# Patient Record
Sex: Female | Born: 1975
Health system: Southern US, Community
[De-identification: ages and names within clinical notes are randomized; demographics above are authoritative.]

## PROBLEM LIST (undated history)

## (undated) DIAGNOSIS — F329 Major depressive disorder, single episode, unspecified: Secondary | ICD-10-CM

## (undated) DIAGNOSIS — F32A Depression, unspecified: Secondary | ICD-10-CM

## (undated) DIAGNOSIS — R109 Unspecified abdominal pain: Secondary | ICD-10-CM

## (undated) DIAGNOSIS — F419 Anxiety disorder, unspecified: Secondary | ICD-10-CM

## (undated) DIAGNOSIS — R51 Headache: Secondary | ICD-10-CM

## (undated) DIAGNOSIS — J302 Other seasonal allergic rhinitis: Secondary | ICD-10-CM

## (undated) DIAGNOSIS — J45909 Unspecified asthma, uncomplicated: Secondary | ICD-10-CM

## (undated) DIAGNOSIS — N3281 Overactive bladder: Principal | ICD-10-CM

## (undated) DIAGNOSIS — T7840XA Allergy, unspecified, initial encounter: Secondary | ICD-10-CM

## (undated) DIAGNOSIS — J343 Hypertrophy of nasal turbinates: Secondary | ICD-10-CM

## (undated) DIAGNOSIS — Z309 Encounter for contraceptive management, unspecified: Secondary | ICD-10-CM

## (undated) DIAGNOSIS — Z800 Family history of malignant neoplasm of digestive organs: Secondary | ICD-10-CM

## (undated) DIAGNOSIS — N949 Unspecified condition associated with female genital organs and menstrual cycle: Principal | ICD-10-CM

## (undated) DIAGNOSIS — R112 Nausea with vomiting, unspecified: Secondary | ICD-10-CM

## (undated) DIAGNOSIS — N898 Other specified noninflammatory disorders of vagina: Secondary | ICD-10-CM

## (undated) DIAGNOSIS — N3010 Interstitial cystitis (chronic) without hematuria: Principal | ICD-10-CM

## (undated) DIAGNOSIS — Z9889 Other specified postprocedural states: Secondary | ICD-10-CM

## (undated) DIAGNOSIS — R350 Frequency of micturition: Secondary | ICD-10-CM

## (undated) DIAGNOSIS — J342 Deviated nasal septum: Secondary | ICD-10-CM

## (undated) DIAGNOSIS — K219 Gastro-esophageal reflux disease without esophagitis: Secondary | ICD-10-CM

## (undated) HISTORY — DX: Other specified noninflammatory disorders of vagina: N89.8

## (undated) HISTORY — DX: Encounter for contraceptive management, unspecified: Z30.9

## (undated) HISTORY — DX: Unspecified asthma, uncomplicated: J45.909

## (undated) HISTORY — DX: Interstitial cystitis (chronic) without hematuria: N30.10

## (undated) HISTORY — DX: Unspecified abdominal pain: R10.9

## (undated) HISTORY — PX: OTHER SURGICAL HISTORY: SHX169

## (undated) HISTORY — DX: Allergy, unspecified, initial encounter: T78.40XA

## (undated) HISTORY — DX: Unspecified condition associated with female genital organs and menstrual cycle: N94.9

## (undated) HISTORY — DX: Overactive bladder: N32.81

## (undated) HISTORY — DX: Family history of malignant neoplasm of digestive organs: Z80.0

---

## 2000-06-14 ENCOUNTER — Other Ambulatory Visit: Admission: RE | Admit: 2000-06-14 | Discharge: 2000-06-14 | Payer: Self-pay | Admitting: *Deleted

## 2001-08-14 ENCOUNTER — Ambulatory Visit (HOSPITAL_COMMUNITY): Admission: RE | Admit: 2001-08-14 | Discharge: 2001-08-14 | Payer: Self-pay | Admitting: General Surgery

## 2001-08-14 HISTORY — PX: CYST EXCISION: SHX5701

## 2001-12-26 ENCOUNTER — Emergency Department (HOSPITAL_COMMUNITY): Admission: EM | Admit: 2001-12-26 | Discharge: 2001-12-27 | Payer: Self-pay | Admitting: Emergency Medicine

## 2003-09-20 ENCOUNTER — Ambulatory Visit (HOSPITAL_COMMUNITY): Admission: RE | Admit: 2003-09-20 | Discharge: 2003-09-20 | Payer: Self-pay | Admitting: *Deleted

## 2005-05-22 ENCOUNTER — Emergency Department (HOSPITAL_COMMUNITY): Admission: EM | Admit: 2005-05-22 | Discharge: 2005-05-22 | Payer: Self-pay | Admitting: Emergency Medicine

## 2005-06-24 ENCOUNTER — Ambulatory Visit (HOSPITAL_COMMUNITY): Admission: RE | Admit: 2005-06-24 | Discharge: 2005-06-24 | Payer: Self-pay | Admitting: Family Medicine

## 2005-06-29 ENCOUNTER — Encounter (HOSPITAL_COMMUNITY): Admission: RE | Admit: 2005-06-29 | Discharge: 2005-07-29 | Payer: Self-pay | Admitting: Family Medicine

## 2005-07-14 ENCOUNTER — Observation Stay (HOSPITAL_COMMUNITY): Admission: RE | Admit: 2005-07-14 | Discharge: 2005-07-15 | Payer: Self-pay | Admitting: General Surgery

## 2005-07-14 ENCOUNTER — Encounter (INDEPENDENT_AMBULATORY_CARE_PROVIDER_SITE_OTHER): Payer: Self-pay | Admitting: *Deleted

## 2005-07-14 HISTORY — PX: CHOLECYSTECTOMY: SHX55

## 2006-04-26 ENCOUNTER — Emergency Department (HOSPITAL_COMMUNITY): Admission: EM | Admit: 2006-04-26 | Discharge: 2006-04-26 | Payer: Self-pay | Admitting: Emergency Medicine

## 2006-06-21 ENCOUNTER — Ambulatory Visit (HOSPITAL_COMMUNITY): Admission: RE | Admit: 2006-06-21 | Discharge: 2006-06-21 | Payer: Self-pay | Admitting: Obstetrics and Gynecology

## 2006-10-20 ENCOUNTER — Inpatient Hospital Stay (HOSPITAL_COMMUNITY): Admission: RE | Admit: 2006-10-20 | Discharge: 2006-10-23 | Payer: Self-pay | Admitting: Obstetrics & Gynecology

## 2006-10-20 ENCOUNTER — Encounter: Payer: Self-pay | Admitting: Obstetrics & Gynecology

## 2007-05-11 ENCOUNTER — Other Ambulatory Visit: Admission: RE | Admit: 2007-05-11 | Discharge: 2007-05-11 | Payer: Self-pay | Admitting: Obstetrics and Gynecology

## 2008-06-04 ENCOUNTER — Other Ambulatory Visit: Admission: RE | Admit: 2008-06-04 | Discharge: 2008-06-04 | Payer: Self-pay | Admitting: Obstetrics & Gynecology

## 2008-07-22 ENCOUNTER — Emergency Department (HOSPITAL_COMMUNITY): Admission: EM | Admit: 2008-07-22 | Discharge: 2008-07-23 | Payer: Self-pay | Admitting: Emergency Medicine

## 2009-06-19 ENCOUNTER — Other Ambulatory Visit: Admission: RE | Admit: 2009-06-19 | Discharge: 2009-06-19 | Payer: Self-pay | Admitting: Obstetrics & Gynecology

## 2010-05-04 LAB — URINALYSIS, ROUTINE W REFLEX MICROSCOPIC
Glucose, UA: NEGATIVE mg/dL
Leukocytes, UA: NEGATIVE
pH: 6 (ref 5.0–8.0)

## 2010-05-04 LAB — COMPREHENSIVE METABOLIC PANEL
ALT: 20 U/L (ref 0–35)
AST: 24 U/L (ref 0–37)
Albumin: 4 g/dL (ref 3.5–5.2)
CO2: 28 mEq/L (ref 19–32)
Calcium: 9.3 mg/dL (ref 8.4–10.5)
Chloride: 102 mEq/L (ref 96–112)
Creatinine, Ser: 0.83 mg/dL (ref 0.4–1.2)
GFR calc Af Amer: >60 mL/min (ref >60)
Glucose, Bld: 113 mg/dL — ABNORMAL HIGH (ref 70–99)
Sodium: 136 mEq/L (ref 135–145)
Total Bilirubin: 0.5 mg/dL (ref 0.3–1.2)

## 2010-05-04 LAB — PREGNANCY, URINE: Preg Test, Ur: NEGATIVE

## 2010-05-04 LAB — DIFFERENTIAL
Basophils Absolute: 0 10*3/uL (ref 0.0–0.1)
Eosinophils Relative: 1 % (ref 0–5)
Lymphs Abs: 1.4 10*3/uL (ref 0.7–4.0)
Monocytes Absolute: 0.7 10*3/uL (ref 0.1–1.0)
Neutro Abs: 6.9 10*3/uL (ref 1.7–7.7)

## 2010-05-04 LAB — URINE MICROSCOPIC-ADD ON

## 2010-05-04 LAB — CBC: MCHC: 34.5 g/dL (ref 30.0–36.0)

## 2010-06-09 NOTE — Discharge Summary (Signed)
NAME:  Amanda Blackwell, Amanda Blackwell                ACCOUNT NO.:  [id number]   MEDICAL RECORD NO.:  [id number]          PATIENT TYPE:  INP   LOCATION:  9135                          FACILITY:  [facility]   PHYSICIAN:  Lazaro Arms, M.D.   DATE OF BIRTH:  Mar 18, 1975   DATE OF ADMISSION:  10/20/2006  DATE OF DISCHARGE:  10/23/2006                               DISCHARGE SUMMARY   REASON FOR ADMISSION:  Primary lower transverse cesarean section.   PRENATAL PROCEDURES:  None.   INTRAPARTUM PROCEDURES:  Primary lower transverse cesarean section for  fetal macrosomia and inadequate pelvis.   POSTPARTUM PROCEDURES:  None.   OPERATIVE AND POSTPARTUM COMPLICATIONS:  None.   DISCHARGE DIAGNOSES:  1. Term pregnancy, delivered.  2. Primary lower transverse cesarean section.   BRIEF HOSPITAL COURSE:  This is a 35 year old who presented as a G2 P0-0-  1-0, at [redacted] weeks gestation for elective primary lower transverse  cesarean section due to an estimated fetal weight of greater than 4000  grams and inadequate pelvis.  Patient underwent primary lower transverse  cesarean section without complications and underwent Dermabond closure.  Delivered a viable female infant with Apgars of 9 and 9 at one and five  minutes respectively.  The patient received spinal anesthesia for the  procedure.  A 3-vessel cord placenta was delivered manually and intact.  No complications occurred intraoperative or postoperative.   Patient received routine postoperative care and was discharged per  routine on postop day #3.   Patient is bottle feeding her infant and desires oral contraceptive  pills for contraception.  She desires to get this prescription from Dr.  Despina Hidden on her postop check, which is scheduled for Wednesday, October 1st.   PERTINENT LABORATORY DATA:  Postoperative hemoglobin is 11 on September  27th.  Prenatal labs showed O positive, antibody negative blood type.  RPR and HIV were nonreactive and Hepatitis B Surface  Antigen was  negative.   DISCHARGE INSTRUCTIONS:  Patient is to avoid heavy lifting and any  sexual activity for 6 weeks.  She can follow a routine regular diet.   DISCHARGE MEDICATIONS:  1. Percocet 5/325 mg, 1 to 2 tabs p.o. every 6 hours as needed.  2. Ibuprofen 600 mg p.o. every 6 hours as needed.  3. Wellbutrin 450 mg p.o. daily.  4. Oral contraceptive pill as prescribed by Dr. Despina Hidden, to begin 2 weeks      postpartum.   Patient was discharged home in stable medical condition with her female  infant who weighed 9 pounds 1 ounce at birth and measured 21 inches in  length.   FOLLOWUP APPOINTMENTS:  1. Patient is to follow up at [facility] in 6 weeks.  2. Patient also has a postop check scheduled for Wednesday, October 26, 2006, with Dr. Despina Hidden.      Drue Dun, M.D.      Lazaro Arms, M.D.  Electronically Signed    EE/MEDQ  D:  10/23/2006  T:  10/23/2006  Job:  [id number]

## 2010-06-09 NOTE — Op Note (Signed)
NAME:  Amanda Blackwell, Amanda Blackwell                ACCOUNT NO.:  [id number]   MEDICAL RECORD NO.:  [id number]          PATIENT TYPE:  INP   LOCATION:  9135                          FACILITY:  [facility]   PHYSICIAN:  Lazaro Arms, M.D.   DATE OF BIRTH:  10/01/1975   DATE OF PROCEDURE:  10/20/2006  DATE OF DISCHARGE:                               OPERATIVE REPORT   PREOPERATIVE DIAGNOSES:  1. Intrauterine pregnancy at 33 weeks' gestation.  2. Estimated fetal weight 4,000 grams.  3. Inadequate pelvis.   POSTOPERATIVE DIAGNOSES:  1. Intrauterine pregnancy at 81 weeks' gestation.  2. Estimated fetal weight 4,000 grams.  3. Inadequate pelvis.   PROCEDURE:  Primary Cesarean section.   SURGEON:  Duane Lope, MD.   Threasa HeadsDarrol Angel.   ANESTHESIA:  Spinal.   FINDINGS:  Over a low transverse hysterotomy incision was delivered a  viable female with Apgars of 9 and 9 weighing 9 pounds and 1 ounce, 3-  vessel cord. Cord blood and cord gas were sent. Underwent routine  neonatal resuscitation. Uterus, tubes and ovaries were normal.   DESCRIPTION OF PROCEDURE:  The patient was taken to the operating room,  placed in the sitting position and once spinal anesthetic placed in  supine position; prepped and draped in the usual sterile fashion.   A Pfannenstiel skin incision was made, carried down sharply to the  rectus fascia, scored in the midline and extended laterally. Fascia was  taken off the muscle superiorly and inferiorly without difficulty,  muscles were dived and the peritoneal cavity was entered. An Alexa self-  retaining wound retractor was placed. A low transverse hysterotomy  incision was made after vesicouterine serosal flaps created. Over this  incision was delivered a viable female infant, Apgar of 9 and 9,  weighing 9 pounds and 1 ounce. There was a 3-vessel. Cord blood and cord  gas were sent. Placenta was delivered spontaneously. Uterus, tubes and  ovaries were normal.   Uterus closed in  2 layers, first being a running interlocking layer, the  second being an imbricating layer and was hemostatic. Pelvis irrigated  vigorously. The Alexa wound retractor was removed. The muscles of the  peritoneum reapproximated loosely, the fascia closed using a 0 Vicryl  running, subcutaneous tissues made hemostatic and irrigated. Skin was  closed using 3-0 Vicryl in a subcuticular fashion on a Keith needle.   The patient tolerated the procedure well. She experienced 500 mL of  blood loss. Taken to the recovery room in good and stable condition. All  counts correct x3.      Lazaro Arms, M.D.  Electronically Signed     LHE/MEDQ  D:  10/20/2006  T:  10/20/2006  Job:  [id number]

## 2010-06-12 NOTE — Op Note (Signed)
NAME:  Blackwell, Amanda                ACCOUNT NO.:  [id number]   MEDICAL RECORD NO.:  [id number]          PATIENT TYPE:  INP   LOCATION:  A313                          FACILITY:  [facility]   PHYSICIAN:  Barbaraann Barthel, M.D. DATE OF BIRTH:  05-19-1975   DATE OF PROCEDURE:  07/14/2005  DATE OF DISCHARGE:                                 OPERATIVE REPORT   SURGEON:  Dr. Malvin Johns.   PREOPERATIVE DIAGNOSIS:  Cholecystitis secondary to biliary dyskinesia.   POSTOPERATIVE DIAGNOSIS:  Cholecystitis secondary to biliary dyskinesia.   PROCEDURE:  Laparoscopic cholecystectomy.   SPECIMEN:  Gallbladder.   NOTE:  This is a 35 year old white female who had recurrent episodes over  the last year of right upper quadrant discomfort accompanied with nausea and  occasional vomiting.  Her liver function studies and amylase were within  normal limits.  Sonogram revealed the appearance of sludge, and a  hepatobiliary scan showed diminished ejection fraction suggesting biliary  dyskinesia.   We discussed surgery with the patient in detail, discussing the possibility  of laparoscopic cholecystectomy, and we told her that the results for  biliary dyskinesia are not as satisfying as for cholelithiasis.  We further  discussed complications not limited to but including bleeding, infection,  damage to bile ducts, perforation of organs and transitory diarrhea.  Informed consent was obtained.   GROSS OPERATIVE FINDINGS:  The patient had minimal adhesions about the  gallbladder, a small cystic duct which was not cannulated.  No stones within  the gallbladder that were obvious.  The right upper quadrant otherwise  appeared to be normal.   TECHNIQUE:  The patient was placed in supine position.  After the adequate  administration of general anesthesia via endotracheal intubation, her entire  abdomen was prepped with Betadine solution and draped in usual manner.  Prior to this, a Foley catheter was aseptically  inserted with the patient in  Trendelenburg.  Umbilical incision was carried out over the superior aspect  of the umbilicus.  The fascia was grasped with a sharp towel clip and  elevated, and Veress needle was inserted and confirmed in position with a  saline drop test.  We then insufflated the abdomen with 3.5 liters of CO2.  We then using the Visiport technique placed an 11-mm [place] surgical cannula in  the umbilicus and then under direct vision an 11-mm cannula in the  epigastrium and two 5-mm cannulas in the right upper quadrant laterally.  Gallbladder was grasped.  Its adhesions were taken down.  Cystic duct was  clearly visualized, triply silver clipped and divided as was the cystic  artery.  The gallbladder was then removed uneventfully from the liver bed.  We used cautery to control the bleeding of liver bed, and I elected to leave  some two pieces Surgicel within the liver bed.  No drain was placed.  We  then checked for hemostasis, and after irrigating, we then desufflated the  abdomen.  We closed the fascia in the area of the umbilicus with 0 Polysorb,  anesthetizing all wounds with 1/2%  Sensorcaine and then closing all wounds surgically  with a stapling device.  Prior to closure, all sponge, needle and instrument counts were found to be  correct.  The patient received 1300 mL of crystalloids intraoperatively.  No  drains were placed, and there were no complications.      Barbaraann Barthel, M.D.  Electronically Signed     WB/MEDQ  D:  07/14/2005  T:  07/14/2005  Job:  [id number]   cc:   Patrica Duel, M.D.  Fax: [phone]

## 2010-06-12 NOTE — Group Therapy Note (Signed)
NAME:  Amanda Blackwell, Amanda Blackwell                          ACCOUNT NO.:  [id number]   MEDICAL RECORD NO.:  [id number]                   PATIENT TYPE:  OUT   LOCATION:  RAD                                  FACILITY:  [facility]   PHYSICIAN:  Langley Gauss, M.D.                DATE OF BIRTH:  05/31/1975   DATE OF PROCEDURE:  DATE OF DISCHARGE:                                   PROGRESS NOTE   The patient presents with a chief complaint of thinks she might have a cyst  on her right ovary.  The patient states the pain has been present x 2 days'  duration.  Notably, she had a menstrual period six days previously as  expected; however, it was very light with spotting only.  Usually her  menstrual periods last anywhere from 3-5 days.  She currently is taking  Cyclessa for menstrual regulation as well as for birth control purposes.  She states that the pain was of somewhat insidious onset but it was severe  enough to cause her to double over, initially, a dull aching pain in the mid  portion of the pelvis then became more localization to the right lower  quadrant.  In addition, she states that though she has not taken her  temperature, the p.m. prior her mother states that she felt as though she  was warm and possibly had a fever.  The patient'Blackwell history is pertinent for  an evaluation, August 13, 2003, for right lower quadrant pain which at that  time was noted to be severe in nature x 2 hours' duration.  By the time I  saw the patient, at that time, the pain had largely abated.  A transvaginal  ultrasound had been performed in the office which revealed normal uterus,  normal ovaries bilaterally and at that time was consistent with a ruptured  ovarian cyst.  The patient states that she had near complete resolution of  her pain at that time.  Subsequently, she has complained of some  intermittent right lower quadrant type pain particularly with her menses.   REVIEW OF SYSTEMS:  Pertinent specifically with  information provided per her  husband, that the patient frequently complains of feeling sick or having  pain in the stomach particularly after eating greasy foods.  However, the  patient denies any significant history of fatty food intolerance.  She  specifically denies any frequent diarrhea or any problems with constipation.  She has never had any bloody or mucinous type of stools.   FAMILY HISTORY:  Negative for Crohn disease, ulcerative colitis, spastic  colon, irritable bowel, or any type of gallbladder disease.   PAST MEDICAL HISTORY:  One prior spontaneous AB, in 2001, that was  complicated by D&C being performed in the office.  Subsequently the patient  had been seen for followup at which time she was noted to have extrusion of  a small amount  of products of conception vaginally here in the office.  The  patient did start on birth control subsequently following this and currently  has no plans for childbirth.   PHYSICAL EXAMINATION:  GENERAL:  She is noted to be in no acute distress.  ABDOMEN:  Soft, nontender.  She does complain of some suprapubic as well as  right lower quadrant pain with some guarding but no rebound or other  peritoneal signs.  PELVIC:  Speculum examination reveals no abnormal discharge from the cervix.   A transvaginal ultrasound is performed which reveals a normal-appearing  uterus.  Both right and left ovaries appear normal in appearance.  Moderate  amount of free fluid is identified within the pelvis.   ASSESSMENT/PLAN:  A patient with vague complaints of right lower quadrant  pain at times suprapubic in nature.  Concern is that of appendix  involvement, thus, at this point in time the patient is to be evaluated with  a CBC, BUN and creatinine, and a CT scan with contrast is scheduled for  September 20, 2003.      ___________________________________________                                            Langley Gauss, M.D.   DC/MEDQ  D:  09/20/2003  T:   09/20/2003  Job:  [id number]

## 2010-06-12 NOTE — Op Note (Signed)
[facility]  Patient:    Amanda Blackwell, FAHS Visit Number: [id number] MRN: [id number]          Service Type: DSU Location: DAY Attending Physician:  Dalia Heading Dictated by:   Franky Macho, M.D. Proc. Date: 08/14/01 Admit Date:  08/14/2001 Discharge Date: 08/14/2001   CC:         Dorthey Sawyer, M.D.   Operative Report  PATIENT AGE:  35 years old.  PREOPERATIVE DIAGNOSIS:  Left brachial cleft cyst, posterior auricular.  POSTOPERATIVE DIAGNOSIS: Left brachial cleft cyst, posterior auricular.  OPERATION:  Excision of brachial cleft cyst, left posterior auricular.  SURGEON:  Franky Macho, M.D.  ANESTHESIA:  General endotracheal.  INDICATIONS:  The patient is a 35 year old white female who presents with a draining cyst in the posterior auricular region.  It appears to be a limited left brachial cleft cyst.  The risks and benefits of the procedure including bleeding, infection, and recurrence of the drainage were fully explained to the patient who gave informed consent.  DESCRIPTION OF PROCEDURE:  The patient was placed in the supine position after general anesthesia was administered.  The left posterior auricular region was prepped and draped using the usual sterile technique with Betadine.  An elliptical incision was made around the cystic lesion which was just in the posterior auricular region.  An additional sebaceous cyst was also excised. The dissection was taken down to the subcutaneous tissue.  Granulation tissue was present, and this was removed and cauterized without difficulty.  The area was injected with 1% Xylocaine with epinephrine.  The tract ended in the subcutaneous tissue.  The skin was reapproximated using a 5-0 nylon interrupted suture.  Neosporin ointment was then applied.  All tape and needle counts were correct at the end of the procedure.  The patient was awakened and transferred to day surgery in stable  condition.  COMPLICATIONS:  None.  SPECIMEN:   Cyst, left posterior auricular.  ESTIMATED BLOOD LOSS:  Minimal. Dictated by:   Franky Macho, M.D. Attending Physician:  Dalia Heading DD:  08/14/01 TD:  08/18/01 Job: [id number]

## 2010-06-12 NOTE — H&P (Signed)
NAME:  Amanda Blackwell, Amanda Blackwell                          ACCOUNT NO.:  [id number]   MEDICAL RECORD NO.:  [id number]                   PATIENT TYPE:  OUT   LOCATION:  RAD                                  FACILITY:  [facility]   PHYSICIAN:  Langley Gauss, M.D.                DATE OF BIRTH:  16-Jun-1975   DATE OF ADMISSION:  09/20/2003  DATE OF DISCHARGE:                                HISTORY & PHYSICAL   HISTORY OF PRESENT ILLNESS:  The patient came to the office with an  unscheduled appointment, following performance of the CT scan with contrast  today.  She states that she was told to begin drinking the contrast this  a.m. which she did without difficulty.  Per her report, and per verbal  report over the phone, CT scan was negative for any type of acute disease.  The patient, however, continues to complain of the right lower-quadrant  pain, and even more specifically now, it is described as being in the right  lower-quadrant area.  The patient is somewhat tearful and apprehensive in  that she wants to know what is wrong with her.   PHYSICAL EXAMINATION:  ABDOMEN:  The abdominal area is carefully palpated.  The area of the discomfort frequently described by the patient actually has  now more localized over to the right iliac crest area.  With only very  superficial manipulation, I was able to produce the patient'Blackwell pain.  Surprisingly, this pain is not exacerbated or present with any muscular  movement, nor is there any limitation.  The patient, however, is minimally  physically active as she describes herself as just being lazy.  Continued  manipulation of this area reveals findings which would be consistent with  bursitis, thus initially 10 cc of 1% lidocaine plain is injected at this  site, followed by a total of 4 mg of IM dexamethasone.  The patient seems to  have some immediate relief from the symptoms described previously as she is  fully ambulatory with no limping.  However, the patient herself  will not at  this point in time admit to any relief.   She is recommended to continue with the p.o. ibuprofen as clinically  indicated.  She is given a prescription for Darvocet on today'Blackwell date.  Injections given today, 10 cc 1% lidocaine plain.  In addition, 4 mg IM  dexamethasone.     ___________________________________________                                         Langley Gauss, M.D.   DC/MEDQ  D:  09/20/2003  T:  09/20/2003  Job:  [id number]

## 2010-08-26 ENCOUNTER — Other Ambulatory Visit: Payer: Self-pay | Admitting: Obstetrics & Gynecology

## 2010-08-26 ENCOUNTER — Other Ambulatory Visit (HOSPITAL_COMMUNITY)
Admission: RE | Admit: 2010-08-26 | Discharge: 2010-08-26 | Disposition: A | Discharge status: Home / Self Care | Payer: Self-pay | Source: Ambulatory Visit | Attending: Obstetrics & Gynecology | Admitting: Obstetrics & Gynecology

## 2010-08-26 DIAGNOSIS — Z01419 Encounter for gynecological examination (general) (routine) without abnormal findings: Secondary | ICD-10-CM | POA: Insufficient documentation

## 2010-10-16 ENCOUNTER — Encounter: Payer: Self-pay | Admitting: *Deleted

## 2010-10-16 ENCOUNTER — Emergency Department (HOSPITAL_COMMUNITY)
Admission: EM | Admit: 2010-10-16 | Discharge: 2010-10-16 | Discharge status: Against Medical Advice | Payer: BC Managed Care – PPO | Attending: Emergency Medicine | Admitting: Emergency Medicine

## 2010-10-16 DIAGNOSIS — Z5320 Procedure and treatment not carried out because of patient's decision for unspecified reasons: Secondary | ICD-10-CM | POA: Insufficient documentation

## 2010-10-16 DIAGNOSIS — R059 Cough, unspecified: Secondary | ICD-10-CM | POA: Insufficient documentation

## 2010-10-16 DIAGNOSIS — R05 Cough: Secondary | ICD-10-CM | POA: Insufficient documentation

## 2010-10-16 NOTE — ED Notes (Signed)
Pt c/o cough, congestion, difficulty breathing, nausea, vomiting and diarrhea. Pt states that her chest feels tight and she feels like she can't breathe.

## 2010-11-05 LAB — TYPE AND SCREEN: ABO/RH(D): O POS

## 2010-11-05 LAB — URINALYSIS, ROUTINE W REFLEX MICROSCOPIC
Bilirubin Urine: NEGATIVE
Nitrite: NEGATIVE
Urobilinogen, UA: 0.2

## 2010-11-05 LAB — CBC
HCT: 31.3 — ABNORMAL LOW
HCT: 31.7 — ABNORMAL LOW
Hemoglobin: 10.8 — ABNORMAL LOW
Hemoglobin: 13.2
MCHC: 34.3
MCV: 89.2
MCV: 89.4
MCV: 90.4
Platelets: 220
RBC: 3.46 — ABNORMAL LOW
RBC: 4.29
RDW: 13.6
RDW: 13.8
WBC: 11.2 — ABNORMAL HIGH
WBC: 11.4 — ABNORMAL HIGH

## 2010-11-05 LAB — URINE MICROSCOPIC-ADD ON

## 2010-11-05 LAB — RPR: RPR Ser Ql: NONREACTIVE

## 2010-11-20 ENCOUNTER — Ambulatory Visit (HOSPITAL_COMMUNITY)
Admission: RE | Admit: 2010-11-20 | Discharge: 2010-11-20 | Disposition: A | Discharge status: Home / Self Care | Payer: BC Managed Care – PPO | Source: Ambulatory Visit | Attending: Family Medicine | Admitting: Family Medicine

## 2010-11-20 ENCOUNTER — Other Ambulatory Visit (HOSPITAL_COMMUNITY): Payer: Self-pay | Admitting: Family Medicine

## 2010-11-20 DIAGNOSIS — J189 Pneumonia, unspecified organism: Secondary | ICD-10-CM | POA: Insufficient documentation

## 2010-11-20 DIAGNOSIS — J209 Acute bronchitis, unspecified: Secondary | ICD-10-CM

## 2010-11-20 DIAGNOSIS — R05 Cough: Secondary | ICD-10-CM

## 2010-11-20 DIAGNOSIS — R509 Fever, unspecified: Secondary | ICD-10-CM

## 2010-11-20 DIAGNOSIS — R059 Cough, unspecified: Secondary | ICD-10-CM | POA: Insufficient documentation

## 2011-08-11 ENCOUNTER — Encounter (HOSPITAL_COMMUNITY): Payer: Self-pay | Admitting: *Deleted

## 2011-08-11 ENCOUNTER — Emergency Department (HOSPITAL_COMMUNITY)
Admission: EM | Admit: 2011-08-11 | Discharge: 2011-08-11 | Disposition: A | Discharge status: Home / Self Care | Payer: Managed Care, Other (non HMO) | Attending: Emergency Medicine | Admitting: Emergency Medicine

## 2011-08-11 ENCOUNTER — Emergency Department (HOSPITAL_COMMUNITY): Payer: Managed Care, Other (non HMO)

## 2011-08-11 DIAGNOSIS — M549 Dorsalgia, unspecified: Secondary | ICD-10-CM | POA: Insufficient documentation

## 2011-08-11 DIAGNOSIS — R079 Chest pain, unspecified: Secondary | ICD-10-CM | POA: Insufficient documentation

## 2011-08-11 DIAGNOSIS — Z9089 Acquired absence of other organs: Secondary | ICD-10-CM | POA: Insufficient documentation

## 2011-08-11 DIAGNOSIS — Z87891 Personal history of nicotine dependence: Secondary | ICD-10-CM | POA: Insufficient documentation

## 2011-08-11 LAB — COMPREHENSIVE METABOLIC PANEL
ALT: 21 U/L (ref 0–35)
Alkaline Phosphatase: 81 U/L (ref 39–117)
BUN: 6 mg/dL (ref 6–23)
CO2: 27 mEq/L (ref 19–32)
Calcium: 9.9 mg/dL (ref 8.4–10.5)
Chloride: 100 mEq/L (ref 96–112)
GFR calc Af Amer: >90 mL/min (ref >90)
GFR calc non Af Amer: >90 mL/min (ref >90)
Glucose, Bld: 111 mg/dL — ABNORMAL HIGH (ref 70–99)

## 2011-08-11 LAB — CBC WITH DIFFERENTIAL/PLATELET
Basophils Absolute: 0 10*3/uL (ref 0.0–0.1)
Basophils Relative: 0 % (ref 0–1)
Eosinophils Absolute: 0.4 10*3/uL (ref 0.0–0.7)
HCT: 39.5 % (ref 36.0–46.0)
MCH: 29.6 pg (ref 26.0–34.0)
MCHC: 33.7 g/dL (ref 30.0–36.0)
Monocytes Absolute: 0.5 10*3/uL (ref 0.1–1.0)
Neutro Abs: 7.6 10*3/uL (ref 1.7–7.7)
Neutrophils Relative %: 70 % (ref 43–77)
RDW: 12.6 % (ref 11.5–15.5)

## 2011-08-11 LAB — LIPASE, BLOOD: Lipase: 22 U/L (ref 11–59)

## 2011-08-11 MED ORDER — SODIUM CHLORIDE 0.9 % IV BOLUS (SEPSIS)
500.0000 mL | Freq: Once | INTRAVENOUS | Status: AC
  Administered 2011-08-11: 500 mL via INTRAVENOUS

## 2011-08-11 MED ORDER — KETOROLAC TROMETHAMINE 30 MG/ML IJ SOLN
30.0000 mg | Freq: Once | INTRAMUSCULAR | Status: AC
  Administered 2011-08-11: 30 mg via INTRAVENOUS
  Filled 2011-08-11: qty 1

## 2011-08-11 MED ORDER — SODIUM CHLORIDE 0.9 % IV SOLN
INTRAVENOUS | Status: DC
  Administered 2011-08-11: 20:00:00 via INTRAVENOUS

## 2011-08-11 MED ORDER — CYCLOBENZAPRINE HCL 10 MG PO TABS: 10.0000 mg | ORAL_TABLET | Freq: Two times a day (BID) | ORAL | Status: AC | PRN

## 2011-08-11 MED ORDER — NAPROXEN 500 MG PO TABS: 500.0000 mg | ORAL_TABLET | Freq: Two times a day (BID) | ORAL | Status: DC

## 2011-08-11 MED ORDER — ONDANSETRON HCL 4 MG/2ML IJ SOLN
4.0000 mg | Freq: Once | INTRAMUSCULAR | Status: AC
  Administered 2011-08-11: 4 mg via INTRAVENOUS
  Filled 2011-08-11: qty 2

## 2011-08-11 NOTE — ED Notes (Signed)
Mid to left sided CP since Sat. Off and on, now radiates to back per pt with SOB, dizziness and nausea, denies vomiting, took Prevacid at home

## 2011-08-11 NOTE — ED Provider Notes (Signed)
History   This chart was scribed for Donnetta Hutching, MD by Charolett Bumpers . The patient was seen in room APA12/APA12. Patient's care was started at 1852.    CSN: [id number]  Arrival date & time 08/11/11  1842   First MD Initiated Contact with Patient 08/11/11 1852      Chief Complaint  Patient presents with  . Chest Pain  . Back Pain    (Consider location/radiation/quality/duration/timing/severity/associated sxs/prior treatment) HPI Amanda Blackwell is a 36 y.o. female who presents to the Emergency Department complaining of intermittent, moderate mid to left-sided chest pain with associated nausea and upper back pain for the past 4 days. Pt states that her symptoms started worsening on Sunday and states that she took Prevacid because she thought it was indigestion. Pt states that her chest pain started to improve after taking Prevacid. Pt states that her chest pain returned last night and has continued to hurt today. Pt reports associated nausea that started today after lunch. Pt reports associated upper back pain that started today. Pt describes back pain as a sharp pain across upper back. Pt describes chest pain as a dull pain, lasting for 2-3 minutes at a time. Pt reports associated SOB with exertion. Pt denies any vomiting or diaphoresis. Pt states that she takes birth control pills. Pt states that she is not a current smoker. Pt denies any prior medical hx or hx or cardiac problems. Pt reports a family h/o MI in her grandmother when she was in her 63-60's.   History reviewed. No pertinent past medical history.  Past Surgical History  Procedure Date  . Cholecystectomy     History reviewed. No pertinent family history.  History  Substance Use Topics  . Smoking status: Former Smoker    Types: Cigarettes    Quit date: 11/26/2010  . Smokeless tobacco: Not on file  . Alcohol Use: No    OB History    Grav Para Term Preterm Abortions TAB SAB Ect Mult Living                   Review of Systems A complete 10 system review of systems was obtained and all systems are negative except as noted in the HPI and PMH.   Allergies  Review of patient's allergies indicates no known allergies.  Home Medications   Current Outpatient Rx  Name Route Sig Dispense Refill  . ALPRAZOLAM 0.5 MG PO TABS Oral Take 0.5 mg by mouth daily as needed. For anxiety     . BUPROPION HCL ER (XL) 150 MG PO TB24 Oral Take 150 mg by mouth 3 (three) times daily.      . DESOGEST-ETH ESTRAD TRIPHASIC 0.1/0.125/0.15 -0.025 MG PO TABS Oral Take 1 tablet by mouth at bedtime.      . GUAIFENESIN ER 600 MG PO TB12 Oral Take 1,200 mg by mouth 2 (two) times daily.      Marland Kitchen LORATADINE 10 MG PO TABS Oral Take 10 mg by mouth daily.      Marland Kitchen PHENYLEPHRINE-DM-GG 2.5-5-100 MG/5ML PO LIQD Oral Take 20 mLs by mouth once as needed. For congestion     . UNKNOWN TO PATIENT Oral Take 1 tablet by mouth daily. Bladder Medication       BP 132/73  Pulse 96  Temp 98.6 F (37 C) (Oral)  Resp 20  Ht 5' 5.5" (1.664 m)  Wt 225 lb (102.059 kg)  BMI 36.87 kg/m2  SpO2 99%  LMP 08/06/2011  Physical Exam  Nursing note and vitals reviewed. Constitutional: She is oriented to person, place, and time. She appears well-developed and well-nourished. No distress.       Overweight.   HENT:  Head: Normocephalic and atraumatic.  Eyes: EOM are normal. Pupils are equal, round, and reactive to light.  Neck: Normal range of motion. Neck supple. No tracheal deviation present.  Cardiovascular: Normal rate, regular rhythm and normal heart sounds.   Pulmonary/Chest: Effort normal and breath sounds normal. No respiratory distress. She has no wheezes. She exhibits tenderness.       Minimal tenderness to left chest wall  Abdominal: Soft. Bowel sounds are normal. She exhibits no distension.  Musculoskeletal: Normal range of motion. She exhibits no edema.  Neurological: She is alert and oriented to person, place, and time. No sensory  deficit.  Skin: Skin is warm and dry.  Psychiatric: She has a normal mood and affect. Her behavior is normal.    ED Course  Procedures (including critical care time)  DIAGNOSTIC STUDIES: Oxygen Saturation is 99% on room air, normal by my interpretation.    COORDINATION OF CARE:  19:19-Discussed planned course of treatment with the patient including an EKG, x-ray and lab work, who is agreeable at this time. Discussed that the risk factors for heart disease are low.   19:30-Medication Orders: Ondansetron (Zofran) injection 4 mg-once; Ketorolac (Toradol) 30 mg/mL injection 30 mg-once; 0.9% sodium chloride infusion-continuous; Sodium chloride 0.9% bolus 500 mL-once.   Results for orders placed during the hospital encounter of 08/11/11  CBC WITH DIFFERENTIAL      Component Value Range   WBC 11.0 (*) 4.0 - 10.5 K/uL   RBC 4.49  3.87 - 5.11 MIL/uL   Hemoglobin 13.3  12.0 - 15.0 g/dL   HCT 60.4  54.0 - 98.1 %   MCV 88.0  78.0 - 100.0 fL   MCH 29.6  26.0 - 34.0 pg   MCHC 33.7  30.0 - 36.0 g/dL   RDW 19.1  47.8 - 29.5 %   Platelets 285  150 - 400 K/uL   Neutrophils Relative 70  43 - 77 %   Neutro Abs 7.6  1.7 - 7.7 K/uL   Lymphocytes Relative 23  12 - 46 %   Lymphs Abs 2.5  0.7 - 4.0 K/uL   Monocytes Relative 5  3 - 12 %   Monocytes Absolute 0.5  0.1 - 1.0 K/uL   Eosinophils Relative 4  0 - 5 %   Eosinophils Absolute 0.4  0.0 - 0.7 K/uL   Basophils Relative 0  0 - 1 %   Basophils Absolute 0.0  0.0 - 0.1 K/uL  COMPREHENSIVE METABOLIC PANEL      Component Value Range   Sodium 136  135 - 145 mEq/L   Potassium 3.8  3.5 - 5.1 mEq/L   Chloride 100  96 - 112 mEq/L   CO2 27  19 - 32 mEq/L   Glucose, Bld 111 (*) 70 - 99 mg/dL   BUN 6  6 - 23 mg/dL   Creatinine, Ser 6.21  0.50 - 1.10 mg/dL   Calcium 9.9  8.4 - 30.8 mg/dL   Total Protein 7.2  6.0 - 8.3 g/dL   Albumin 3.8  3.5 - 5.2 g/dL   AST 12  0 - 37 U/L   ALT 21  0 - 35 U/L   Alkaline Phosphatase 81  39 - 117 U/L   Total  Bilirubin 0.3  0.3 - 1.2 mg/dL   GFR  calc non Af Amer >90  >90 mL/min   GFR calc Af Amer >90  >90 mL/min  LIPASE, BLOOD      Component Value Range   Lipase 22  11 - 59 U/L  TROPONIN I      Component Value Range   Troponin I <0.30  <0.30 ng/mL  D-DIMER, QUANTITATIVE      Component Value Range   D-Dimer, Quant <0.22  0.00 - 0.48 ug/mL-FEU     Dg Chest 2 View  08/11/2011  *RADIOLOGY REPORT*  Clinical Data: Chest pain, former smoker  CHEST - 2 VIEW  Comparison: 11/20/2010  Findings: Cardiomediastinal silhouette is within normal limits. The lungs are clear. No pleural effusion.  No pneumothorax.  No acute osseous abnormality.  IMPRESSION: Normal chest.  Original Report Authenticated By: Harrel Lemon, M.D.     No diagnosis found.    Date: 08/11/2011  Rate: 96  Rhythm: normal sinus rhythm  QRS Axis: normal  Intervals: normal  ST/T Wave abnormalities: normal  Conduction Disutrbances:right bundle branch block  incomplete  Narrative Interpretation:   Old EKG Reviewed: none available    MDM  Atypical history of chest pain. Symptoms do not fit for acute coronary syndrome or pulmonary embolus. Screening tests negative including troponin and d-dimer   I personally performed the services described in this documentation, which was scribed in my presence. The recorded information has been reviewed and considered.       Donnetta Hutching, MD 08/11/11 2242

## 2011-08-30 ENCOUNTER — Other Ambulatory Visit (HOSPITAL_COMMUNITY)
Admission: RE | Admit: 2011-08-30 | Discharge: 2011-08-30 | Disposition: A | Discharge status: Home / Self Care | Payer: Managed Care, Other (non HMO) | Source: Ambulatory Visit | Attending: Obstetrics & Gynecology | Admitting: Obstetrics & Gynecology

## 2011-08-30 ENCOUNTER — Other Ambulatory Visit: Payer: Self-pay | Admitting: Obstetrics & Gynecology

## 2011-08-30 DIAGNOSIS — Z01419 Encounter for gynecological examination (general) (routine) without abnormal findings: Secondary | ICD-10-CM | POA: Insufficient documentation

## 2012-05-18 ENCOUNTER — Ambulatory Visit (INDEPENDENT_AMBULATORY_CARE_PROVIDER_SITE_OTHER): Payer: Managed Care, Other (non HMO) | Admitting: Otolaryngology

## 2012-05-18 DIAGNOSIS — J343 Hypertrophy of nasal turbinates: Secondary | ICD-10-CM

## 2012-05-18 DIAGNOSIS — J310 Chronic rhinitis: Secondary | ICD-10-CM

## 2012-05-18 DIAGNOSIS — J342 Deviated nasal septum: Secondary | ICD-10-CM

## 2012-06-15 ENCOUNTER — Other Ambulatory Visit: Payer: Self-pay | Admitting: Obstetrics & Gynecology

## 2012-06-15 ENCOUNTER — Ambulatory Visit (INDEPENDENT_AMBULATORY_CARE_PROVIDER_SITE_OTHER): Payer: Managed Care, Other (non HMO) | Admitting: Otolaryngology

## 2012-06-15 DIAGNOSIS — J343 Hypertrophy of nasal turbinates: Secondary | ICD-10-CM

## 2012-06-15 DIAGNOSIS — J342 Deviated nasal septum: Secondary | ICD-10-CM

## 2012-06-25 DIAGNOSIS — J343 Hypertrophy of nasal turbinates: Secondary | ICD-10-CM

## 2012-06-25 DIAGNOSIS — J342 Deviated nasal septum: Secondary | ICD-10-CM

## 2012-06-25 HISTORY — DX: Hypertrophy of nasal turbinates: J34.3

## 2012-06-25 HISTORY — DX: Deviated nasal septum: J34.2

## 2012-07-20 ENCOUNTER — Encounter (HOSPITAL_BASED_OUTPATIENT_CLINIC_OR_DEPARTMENT_OTHER): Payer: Self-pay | Admitting: *Deleted

## 2012-07-24 ENCOUNTER — Encounter (HOSPITAL_BASED_OUTPATIENT_CLINIC_OR_DEPARTMENT_OTHER): Payer: Self-pay

## 2012-07-24 ENCOUNTER — Encounter (HOSPITAL_BASED_OUTPATIENT_CLINIC_OR_DEPARTMENT_OTHER)
Admission: RE | Disposition: A | Discharge status: Home / Self Care | Payer: Self-pay | Source: Ambulatory Visit | Attending: Otolaryngology

## 2012-07-24 ENCOUNTER — Encounter (HOSPITAL_BASED_OUTPATIENT_CLINIC_OR_DEPARTMENT_OTHER): Payer: Self-pay | Admitting: Certified Registered"

## 2012-07-24 ENCOUNTER — Ambulatory Visit (HOSPITAL_BASED_OUTPATIENT_CLINIC_OR_DEPARTMENT_OTHER): Payer: Managed Care, Other (non HMO) | Admitting: Certified Registered"

## 2012-07-24 ENCOUNTER — Ambulatory Visit (HOSPITAL_BASED_OUTPATIENT_CLINIC_OR_DEPARTMENT_OTHER)
Admission: RE | Admit: 2012-07-24 | Discharge: 2012-07-24 | Disposition: A | Discharge status: Home / Self Care | Payer: Managed Care, Other (non HMO) | Source: Ambulatory Visit | Attending: Otolaryngology | Admitting: Otolaryngology

## 2012-07-24 DIAGNOSIS — J310 Chronic rhinitis: Secondary | ICD-10-CM | POA: Insufficient documentation

## 2012-07-24 DIAGNOSIS — Z9889 Other specified postprocedural states: Secondary | ICD-10-CM

## 2012-07-24 DIAGNOSIS — J343 Hypertrophy of nasal turbinates: Secondary | ICD-10-CM | POA: Insufficient documentation

## 2012-07-24 DIAGNOSIS — J342 Deviated nasal septum: Secondary | ICD-10-CM | POA: Insufficient documentation

## 2012-07-24 DIAGNOSIS — K219 Gastro-esophageal reflux disease without esophagitis: Secondary | ICD-10-CM | POA: Insufficient documentation

## 2012-07-24 HISTORY — DX: Gastro-esophageal reflux disease without esophagitis: K21.9

## 2012-07-24 HISTORY — DX: Headache: R51

## 2012-07-24 HISTORY — DX: Frequency of micturition: R35.0

## 2012-07-24 HISTORY — DX: Depression, unspecified: F32.A

## 2012-07-24 HISTORY — DX: Hypertrophy of nasal turbinates: J34.3

## 2012-07-24 HISTORY — DX: Major depressive disorder, single episode, unspecified: F32.9

## 2012-07-24 HISTORY — DX: Anxiety disorder, unspecified: F41.9

## 2012-07-24 HISTORY — DX: Other specified postprocedural states: R11.2

## 2012-07-24 HISTORY — DX: Other seasonal allergic rhinitis: J30.2

## 2012-07-24 HISTORY — DX: Deviated nasal septum: J34.2

## 2012-07-24 HISTORY — PX: NASAL SEPTOPLASTY W/ TURBINOPLASTY: SHX2070

## 2012-07-24 HISTORY — DX: Other specified postprocedural states: Z98.890

## 2012-07-24 SURGERY — SEPTOPLASTY, NOSE, WITH NASAL TURBINATE REDUCTION
Anesthesia: General | Site: Nose | Laterality: Bilateral | Wound class: Clean Contaminated

## 2012-07-24 MED ORDER — HYDROMORPHONE HCL PF 1 MG/ML IJ SOLN
0.2500 mg | INTRAMUSCULAR | Status: DC | PRN
  Administered 2012-07-24: 0.5 mg via INTRAVENOUS

## 2012-07-24 MED ORDER — MIDAZOLAM HCL 2 MG/ML PO SYRP: 12.0000 mg | ORAL_SOLUTION | Freq: Once | ORAL | Status: DC | PRN

## 2012-07-24 MED ORDER — FENTANYL CITRATE 0.05 MG/ML IJ SOLN
INTRAMUSCULAR | Status: DC | PRN
  Administered 2012-07-24: 50 ug via INTRAVENOUS
  Administered 2012-07-24: 100 ug via INTRAVENOUS

## 2012-07-24 MED ORDER — OXYMETAZOLINE HCL 0.05 % NA SOLN
NASAL | Status: DC | PRN
  Administered 2012-07-24: 1 via NASAL

## 2012-07-24 MED ORDER — MIDAZOLAM HCL 5 MG/5ML IJ SOLN
INTRAMUSCULAR | Status: DC | PRN
  Administered 2012-07-24: 2 mg via INTRAVENOUS

## 2012-07-24 MED ORDER — BACITRACIN ZINC 500 UNIT/GM EX OINT
TOPICAL_OINTMENT | CUTANEOUS | Status: DC | PRN
  Administered 2012-07-24: 1 via TOPICAL

## 2012-07-24 MED ORDER — DEXAMETHASONE SODIUM PHOSPHATE 4 MG/ML IJ SOLN
INTRAMUSCULAR | Status: DC | PRN
  Administered 2012-07-24: 10 mg via INTRAVENOUS

## 2012-07-24 MED ORDER — AMOXICILLIN 875 MG PO TABS: 875.0000 mg | ORAL_TABLET | Freq: Two times a day (BID) | ORAL | Status: AC

## 2012-07-24 MED ORDER — ONDANSETRON HCL 4 MG/2ML IJ SOLN
INTRAMUSCULAR | Status: DC | PRN
  Administered 2012-07-24: 4 mg via INTRAVENOUS

## 2012-07-24 MED ORDER — ONDANSETRON HCL 4 MG/2ML IJ SOLN: 4.0000 mg | Freq: Once | INTRAMUSCULAR | Status: DC | PRN

## 2012-07-24 MED ORDER — OXYCODONE HCL 5 MG PO TABS
5.0000 mg | ORAL_TABLET | Freq: Once | ORAL | Status: AC | PRN
  Administered 2012-07-24: 5 mg via ORAL

## 2012-07-24 MED ORDER — OXYCODONE-ACETAMINOPHEN 5-325 MG PO TABS: 1.0000 | ORAL_TABLET | ORAL | Status: DC | PRN

## 2012-07-24 MED ORDER — PROPOFOL 10 MG/ML IV BOLUS
INTRAVENOUS | Status: DC | PRN
  Administered 2012-07-24: 50 mg via INTRAVENOUS
  Administered 2012-07-24: 300 mg via INTRAVENOUS

## 2012-07-24 MED ORDER — SUCCINYLCHOLINE CHLORIDE 20 MG/ML IJ SOLN
INTRAMUSCULAR | Status: DC | PRN
  Administered 2012-07-24: 140 mg via INTRAVENOUS

## 2012-07-24 MED ORDER — OXYCODONE HCL 5 MG/5ML PO SOLN: 5.0000 mg | Freq: Once | ORAL | Status: AC | PRN

## 2012-07-24 MED ORDER — LIDOCAINE HCL (CARDIAC) 20 MG/ML IV SOLN
INTRAVENOUS | Status: DC | PRN
  Administered 2012-07-24: 100 mg via INTRAVENOUS

## 2012-07-24 MED ORDER — LACTATED RINGERS IV SOLN
INTRAVENOUS | Status: DC
  Administered 2012-07-24 (×2): via INTRAVENOUS

## 2012-07-24 MED ORDER — FENTANYL CITRATE 0.05 MG/ML IJ SOLN: 50.0000 ug | INTRAMUSCULAR | Status: DC | PRN

## 2012-07-24 MED ORDER — MIDAZOLAM HCL 2 MG/2ML IJ SOLN: 1.0000 mg | INTRAMUSCULAR | Status: DC | PRN

## 2012-07-24 MED ORDER — LIDOCAINE-EPINEPHRINE 1 %-1:100000 IJ SOLN
INTRAMUSCULAR | Status: DC | PRN
  Administered 2012-07-24: 3 mL

## 2012-07-24 SURGICAL SUPPLY — 36 items
ATTRACTOMAT 16X20 MAGNETIC DRP (DRAPES) IMPLANT
BLADE SURG 15 STRL LF DISP TIS (BLADE) IMPLANT
BLADE SURG 15 STRL SS (BLADE)
CANISTER SUCTION 1200CC (MISCELLANEOUS) ×2 IMPLANT
CLOTH BEACON ORANGE TIMEOUT ST (SAFETY) ×2 IMPLANT
COAGULATOR SUCT 8FR VV (MISCELLANEOUS) ×2 IMPLANT
DECANTER SPIKE VIAL GLASS SM (MISCELLANEOUS) IMPLANT
DRSG NASOPORE 8CM (GAUZE/BANDAGES/DRESSINGS) IMPLANT
DRSG TELFA 3X8 NADH (GAUZE/BANDAGES/DRESSINGS) IMPLANT
ELECT REM PT RETURN 9FT ADLT (ELECTROSURGICAL) ×2
ELECTRODE REM PT RTRN 9FT ADLT (ELECTROSURGICAL) ×1 IMPLANT
GLOVE BIO SURGEON STRL SZ7.5 (GLOVE) ×2 IMPLANT
GLOVE BIOGEL M STRL SZ7.5 (GLOVE) ×1 IMPLANT
GLOVE BIOGEL PI IND STRL 7.5 (GLOVE) ×1 IMPLANT
GLOVE BIOGEL PI INDICATOR 7.5 (GLOVE) ×1
GOWN PREVENTION PLUS XLARGE (GOWN DISPOSABLE) ×4 IMPLANT
NDL HYPO 25X1 1.5 SAFETY (NEEDLE) ×1 IMPLANT
NEEDLE HYPO 25X1 1.5 SAFETY (NEEDLE) ×2 IMPLANT
NS IRRIG 1000ML POUR BTL (IV SOLUTION) ×2 IMPLANT
PACK BASIN DAY SURGERY FS (CUSTOM PROCEDURE TRAY) ×2 IMPLANT
PACK ENT DAY SURGERY (CUSTOM PROCEDURE TRAY) ×2 IMPLANT
PAD DRESSING TELFA 3X8 NADH (GAUZE/BANDAGES/DRESSINGS) IMPLANT
SLEEVE SCD COMPRESS KNEE MED (MISCELLANEOUS) IMPLANT
SOLUTION BUTLER CLEAR DIP (MISCELLANEOUS) ×2 IMPLANT
SPLINT NASAL DOYLE BI-VL (GAUZE/BANDAGES/DRESSINGS) ×2 IMPLANT
SPONGE GAUZE 2X2 8PLY STRL LF (GAUZE/BANDAGES/DRESSINGS) ×2 IMPLANT
SPONGE NEURO XRAY DETECT 1X3 (DISPOSABLE) ×2 IMPLANT
SUT CHROMIC 4 0 P 3 18 (SUTURE) ×2 IMPLANT
SUT PLAIN 4 0 ~~LOC~~ 1 (SUTURE) ×2 IMPLANT
SUT PROLENE 3 0 PS 2 (SUTURE) ×2 IMPLANT
SUT VIC AB 4-0 P-3 18XBRD (SUTURE) IMPLANT
SUT VIC AB 4-0 P3 18 (SUTURE)
TOWEL OR 17X24 6PK STRL BLUE (TOWEL DISPOSABLE) ×2 IMPLANT
TUBE SALEM SUMP 12R W/ARV (TUBING) IMPLANT
TUBE SALEM SUMP 16 FR W/ARV (TUBING) ×2 IMPLANT
YANKAUER SUCT BULB TIP NO VENT (SUCTIONS) ×2 IMPLANT

## 2012-07-24 NOTE — Transfer of Care (Signed)
Immediate Anesthesia Transfer of Care Note  Patient: Amanda Blackwell  Procedure(s) Performed: Procedure(s): NASAL SEPTOPLASTY WITH BILATERAL TURBINATE RESECTION (Bilateral)  Patient Location: PACU  Anesthesia Type:General  Level of Consciousness: awake, alert , oriented and patient cooperative  Airway & Oxygen Therapy: Patient Spontanous Breathing and aerosol face mask  Post-op Assessment: Report given to PACU RN and Post -op Vital signs reviewed and stable  Post vital signs: Reviewed and stable  Complications: No apparent anesthesia complications

## 2012-07-24 NOTE — Anesthesia Postprocedure Evaluation (Signed)
  Anesthesia Post-op Note  Patient: Amanda Blackwell  Procedure(s) Performed: Procedure(s): NASAL SEPTOPLASTY WITH BILATERAL TURBINATE RESECTION (Bilateral)  Patient Location: PACU  Anesthesia Type:General  Level of Consciousness: awake, alert  and oriented  Airway and Oxygen Therapy: Patient Spontanous Breathing  Post-op Pain: mild  Post-op Assessment: Post-op Vital signs reviewed  Post-op Vital Signs: Reviewed  Complications: No apparent anesthesia complications

## 2012-07-24 NOTE — Anesthesia Procedure Notes (Signed)
Procedure Name: Intubation Date/Time: 07/24/2012 10:32 AM Performed by: Verlan Friends Pre-anesthesia Checklist: Patient identified, Emergency Drugs available, Suction available, Patient being monitored and Timeout performed Patient Re-evaluated:Patient Re-evaluated prior to inductionOxygen Delivery Method: Circle System Utilized Preoxygenation: Pre-oxygenation with 100% oxygen Intubation Type: IV induction Ventilation: Mask ventilation without difficulty Laryngoscope Size: Miller and 3 Grade View: Grade II Tube type: Oral Tube size: 7.0 mm Number of attempts: 1 Airway Equipment and Method: stylet and oral airway Placement Confirmation: ETT inserted through vocal cords under direct vision,  positive ETCO2 and breath sounds checked- equal and bilateral Secured at: 20 cm Tube secured with: Tape Dental Injury: Teeth and Oropharynx as per pre-operative assessment

## 2012-07-24 NOTE — H&P (Signed)
Cc: Chronic nasal congestion  HPI: The patient is a 37 year old female who returns today for her follow up evaluation. The patient was last seen on 05/18/2012.  She was noted to have chronic rhinitis with nasal mucosal congestion, nasal septal deviation, and left inferior septal spur.  Her inferior turbinates were significantly hypertrophied. The patient was treated with a 6 day course of prednisone, Dymista, and saline irrigation.  The patient did note some mild improvement in her symptoms with the prednsione but her congestion has returned.  She has noted occasional facial pain and pressure but this is relieved with decongestants.  No other ENT, GI, or respiratory issue noted since the last visit.  Exam: General: Communicates without difficulty, well nourished, no acute distress.   Head: Normocephalic, no evidence injury, no tenderness, facial buttresses intact without stepoff.   Eyes: PERRL, EOMI.   No scleral icterus, conjunctivae clear.   Neuro: CN II exam reveals vision grossly intact.   No nystagmus at any point of gaze.   Ears: Auricles well formed without lesions.   Ear canals are intact without mass or lesion.   No erythema or edema is appreciated.   The TMs are intact without fluid.   Nose: External evaluation reveals normal support and skin without lesions.   Dorsum is intact.   Anterior rhinoscopy reveals significantly congested and edematous mucosa over anterior aspect of the inferior turbinates and nasal septum.   No purulence is noted.   The middle meatus could not be visualized.   NSD is noted.   Oral:  Oral cavity and oropharynx are intact, symmetric, without erythema or edema.   Mucosa is moist without lesions.   Neck: Full range of motion without pain.   There is no significant lymphadenopathy.   No masses palpable.   Thyroid bed within normal limits to palpation.   Parotid glands and submandibular glands equal bilaterally without mass.   Trachea is midline.   Neuro:  CN 2-12 grossly  intact.   Gait normal.  A: 1.   The patient is noted to have chronic rhinitis with nasal mucosal congestion, nasal septal deviation, and left inferior septal spur.   Her inferior turbinates remain significantly hypertrophied.   More than 90% of her bilateral nasal airways are obstructed. 2.   There is no evidence of acute sinusitis or polypoid tissue noted on todays nasal endoscopy examination.  P: 1.  In light of the persistent nasal congestion, she would likely benefit from undergoing the septoplasty and turbinate reduction procedures.  The risks, benefits, and details of the treatment modalities are discussed. All questions and concerns are addressed.  2.  The patient would like to proceed with the procedure.  3.  She will continue with Dymista and saline irrigation in the interim.

## 2012-07-24 NOTE — Op Note (Signed)
DATE OF PROCEDURE: 07/24/2012  OPERATIVE REPORT   SURGEON: Newman Pies, MD   PREOPERATIVE DIAGNOSES:  1. Severe nasal septal deviation.  2. Bilateral inferior turbinate hypertrophy.  3. Chronic nasal obstruction.  POSTOPERATIVE DIAGNOSES:  1. Severe nasal septal deviation.  2. Bilateral inferior turbinate hypertrophy.  3. Chronic nasal obstruction.  PROCEDURE PERFORMED:  1. Septoplasty.  2. Bilateral partial inferior turbinate resection.   ANESTHESIA: General endotracheal tube anesthesia.   COMPLICATIONS: None.   ESTIMATED BLOOD LOSS: Less than xxx mL.   INDICATION FOR PROCEDURE: Amanda Blackwell is a 37 y.o. female with a history of chronic nasal obstruction. The patient was  treated with antihistamine, decongestant, steroid nasal spray, and systemic steroids. However, the patient continues to be symptomatic. On examination, the patient was noted to have bilateral severe inferior turbinate hypertrophy and significant nasal septal deviation, causing significant nasal obstruction. Based on the above findings, the decision was made for the patient to undergo the above-stated procedure. The risks, benefits, alternatives, and details of the procedure were discussed with the patient. Questions were invited and answered. Informed consent was obtained.   DESCRIPTION OF PROCEDURE: The patient was taken to the operating room and placed supine on the operating table. General endotracheal tube anesthesia was administered by the anesthesiologist. The patient was positioned, and prepped and draped in the standard fashion for nasal surgery. Pledgets soaked with Afrin were placed in both nasal cavities for decongestion. The pledgets were subsequently removed. The above mentioned severe septal deviation was again noted. 1% lidocaine with 1:100,000 epinephrine was injected onto the nasal septum bilaterally. A hemitransfixion incision was made on the left side. The mucosal flap was carefully elevated on the left  side. A cartilaginous incision was made 1 cm superior to the caudal margin of the nasal septum. Mucosal flap was also elevated on the right side in the similar fashion. It should be noted that due to the severe septal deviation, the deviated portion of the cartilaginous and bony septum had to be removed in piecemeal fashion. Once the deviated portions were removed, a straight midline septum was achieved. The septum was then quilted with 4-0 plain gut sutures. The hemitransfixion incision was closed with interrupted 4-0 chromic sutures. Doyle splints were applied.   Prior to the [facility] splint application, the inferior one half of both hypertrophied inferior turbinate was crossclamped with a Kelly clamp. The inferior one half of each inferior turbinate was then resected with a pair of cross cutting scissors. Hemostasis was achieved with a suction cautery device.   The care of the patient was turned over to the anesthesiologist. The patient was awakened from anesthesia without difficulty. The patient was extubated and transferred to the recovery room in good condition.   OPERATIVE FINDINGS: Severe nasal septal deviation and bilateral inferior turbinate hypertrophy.   SPECIMEN: None.   FOLLOWUP CARE: The patient be discharged home once she is awake and alert. The patient will be placed on Percocet 1-2 tablets p.o. q.6 hours p.r.n. pain, and amoxicillin 875 mg p.o. b.i.d. for 5 days. The patient will follow up in my office in approximately 1 week for splint removal.   Amanda Degante Philomena Doheny, MD

## 2012-07-24 NOTE — Anesthesia Preprocedure Evaluation (Signed)
Anesthesia Evaluation  Patient identified by MRN, date of birth, ID band Patient awake    Reviewed: Allergy & Precautions, H&P , NPO status , Patient's Chart, lab work & pertinent test results  History of Anesthesia Complications (+) PONV  Airway Mallampati: I TM Distance: >3 FB Neck ROM: Full    Dental  (+) Teeth Intact and Dental Advisory Given   Pulmonary  breath sounds clear to auscultation        Cardiovascular Rhythm:Regular Rate:Normal     Neuro/Psych    GI/Hepatic GERD-  Medicated and Controlled,  Endo/Other    Renal/GU      Musculoskeletal   Abdominal   Peds  Hematology   Anesthesia Other Findings   Reproductive/Obstetrics                           Anesthesia Physical Anesthesia Plan  ASA: II  Anesthesia Plan: General   Post-op Pain Management:    Induction: Intravenous  Airway Management Planned: Oral ETT  Additional Equipment:   Intra-op Plan:   Post-operative Plan: Extubation in OR  Informed Consent: I have reviewed the patients History and Physical, chart, labs and discussed the procedure including the risks, benefits and alternatives for the proposed anesthesia with the patient or authorized representative who has indicated his/her understanding and acceptance.   Dental advisory given  Plan Discussed with: CRNA, Anesthesiologist and Surgeon  Anesthesia Plan Comments:         Anesthesia Quick Evaluation

## 2012-07-25 ENCOUNTER — Encounter (HOSPITAL_BASED_OUTPATIENT_CLINIC_OR_DEPARTMENT_OTHER): Payer: Self-pay | Admitting: Otolaryngology

## 2012-07-27 ENCOUNTER — Ambulatory Visit (INDEPENDENT_AMBULATORY_CARE_PROVIDER_SITE_OTHER): Payer: Managed Care, Other (non HMO) | Admitting: Otolaryngology

## 2012-08-10 ENCOUNTER — Ambulatory Visit (INDEPENDENT_AMBULATORY_CARE_PROVIDER_SITE_OTHER): Payer: Managed Care, Other (non HMO) | Admitting: Otolaryngology

## 2012-09-02 ENCOUNTER — Other Ambulatory Visit: Payer: Self-pay | Admitting: Obstetrics & Gynecology

## 2012-09-07 ENCOUNTER — Encounter: Payer: Self-pay | Admitting: Obstetrics & Gynecology

## 2012-09-07 ENCOUNTER — Ambulatory Visit (INDEPENDENT_AMBULATORY_CARE_PROVIDER_SITE_OTHER): Payer: Managed Care, Other (non HMO) | Admitting: Obstetrics & Gynecology

## 2012-09-07 VITALS — BP 120/80 | Ht 64.0 in | Wt 243.0 lb

## 2012-09-07 DIAGNOSIS — Z01419 Encounter for gynecological examination (general) (routine) without abnormal findings: Secondary | ICD-10-CM

## 2012-09-07 MED ORDER — DESOGEST-ETH ESTRAD TRIPHASIC 0.1/0.125/0.15 -0.025 MG PO TABS: ORAL_TABLET | ORAL | Status: DC

## 2012-09-07 MED ORDER — BUPROPION HCL ER (XL) 150 MG PO TB24: ORAL_TABLET | ORAL | Status: DC

## 2012-09-07 NOTE — Progress Notes (Signed)
Patient ID: Amanda Blackwell, female   DOB: 05/13/1975, 37 y.o.   MRN: [id number] Subjective:     Amanda Blackwell is a 37 y.o. female here for a routine exam.  Patient's last menstrual period was 09/02/2012. No obstetric history on file. Current complaints: none.  Personal health questionnaire reviewed: no.   Gynecologic History Patient's last menstrual period was 09/02/2012. Contraception: OCP (estrogen/progesterone) Last Pap: 2013. Results were: normal Last mammogram: na. Results were: na  Obstetric History OB History  No data available     The following portions of the patient's history were reviewed and updated as appropriate: allergies, current medications, past family history, past medical history, past social history, past surgical history and problem list.  Review of Systems  Review of Systems  Constitutional: Negative for fever, chills, weight loss, malaise/fatigue and diaphoresis.  HENT: Negative for hearing loss, ear pain, nosebleeds, congestion, sore throat, neck pain, tinnitus and ear discharge.   Eyes: Negative for blurred vision, double vision, photophobia, pain, discharge and redness.  Respiratory: Negative for cough, hemoptysis, sputum production, shortness of breath, wheezing and stridor.   Cardiovascular: Negative for chest pain, palpitations, orthopnea, claudication, leg swelling and PND.  Gastrointestinal: negative for abdominal pain. Negative for heartburn, nausea, vomiting, diarrhea, constipation, blood in stool and melena.  Genitourinary: Negative for dysuria, urgency, frequency, hematuria and flank pain.  Musculoskeletal: Negative for myalgias, back pain, joint pain and falls.  Skin: Negative for itching and rash.  Neurological: Negative for dizziness, tingling, tremors, sensory change, speech change, focal weakness, seizures, loss of consciousness, weakness and headaches.  Endo/Heme/Allergies: Negative for environmental allergies and polydipsia. Does not  bruise/bleed easily.  Psychiatric/Behavioral: Negative for depression, suicidal ideas, hallucinations, memory loss and substance abuse. The patient is not nervous/anxious and does not have insomnia.        Objective:    Physical Exam  Vitals reviewed. Constitutional: She is oriented to person, place, and time. She appears well-developed and well-nourished.  HENT:  Head: Normocephalic and atraumatic.        Right Ear: External ear normal.  Left Ear: External ear normal.  Nose: Nose normal.  Mouth/Throat: Oropharynx is clear and moist.  Eyes: Conjunctivae and EOM are normal. Pupils are equal, round, and reactive to light. Right eye exhibits no discharge. Left eye exhibits no discharge. No scleral icterus.  Neck: Normal range of motion. Neck supple. No tracheal deviation present. No thyromegaly present.  Cardiovascular: Normal rate, regular rhythm, normal heart sounds and intact distal pulses.  Exam reveals no gallop and no friction rub.   No murmur heard. Respiratory: Effort normal and breath sounds normal. No respiratory distress. She has no wheezes. She has no rales. She exhibits no tenderness.  GI: Soft. Bowel sounds are normal. She exhibits no distension and no mass. There is no tenderness. There is no rebound and no guarding.  Genitourinary:       Vulva is normal without lesions Vagina is pink moist without discharge Cervix normal in appearance and pap is done Uterus is normal size shape and contour Adnexa is negative with normal sized ovaries   Musculoskeletal: Normal range of motion. She exhibits no edema and no tenderness.  Neurological: She is alert and oriented to person, place, and time. She has normal reflexes. She displays normal reflexes. No cranial nerve deficit. She exhibits normal muscle tone. Coordination normal.  Skin: Skin is warm and dry. No rash noted. No erythema. No pallor.  Psychiatric: She has a normal mood and affect. Her behavior  is normal. Judgment and  thought content normal.       Assessment:    Healthy female exam.    Plan:    Contraception: OCP (estrogen/progesterone). Follow up in: 1 year.

## 2012-09-08 ENCOUNTER — Other Ambulatory Visit (HOSPITAL_COMMUNITY)
Admission: RE | Admit: 2012-09-08 | Discharge: 2012-09-08 | Disposition: A | Discharge status: Home / Self Care | Payer: Managed Care, Other (non HMO) | Source: Ambulatory Visit | Attending: Obstetrics & Gynecology | Admitting: Obstetrics & Gynecology

## 2012-09-08 DIAGNOSIS — Z1151 Encounter for screening for human papillomavirus (HPV): Secondary | ICD-10-CM | POA: Insufficient documentation

## 2012-09-08 DIAGNOSIS — Z01419 Encounter for gynecological examination (general) (routine) without abnormal findings: Secondary | ICD-10-CM | POA: Insufficient documentation

## 2012-09-08 NOTE — Addendum Note (Signed)
Addended by: Richardson Chiquito on: 09/08/2012 09:06 AM   Modules accepted: Orders

## 2012-09-08 NOTE — Addendum Note (Signed)
Addended by: Malachy Mood S on: 09/08/2012 11:10 AM   Modules accepted: Orders

## 2012-09-08 NOTE — Addendum Note (Signed)
Addended by: Colen Darling on: 09/08/2012 08:45 AM   Modules accepted: Orders

## 2012-10-04 ENCOUNTER — Encounter: Payer: Self-pay | Admitting: Family Medicine

## 2013-02-14 ENCOUNTER — Ambulatory Visit (INDEPENDENT_AMBULATORY_CARE_PROVIDER_SITE_OTHER): Payer: Managed Care, Other (non HMO)

## 2013-02-14 ENCOUNTER — Encounter (INDEPENDENT_AMBULATORY_CARE_PROVIDER_SITE_OTHER): Payer: Self-pay

## 2013-02-14 ENCOUNTER — Encounter: Payer: Self-pay | Admitting: Adult Health

## 2013-02-14 ENCOUNTER — Other Ambulatory Visit: Payer: Self-pay | Admitting: Adult Health

## 2013-02-14 ENCOUNTER — Ambulatory Visit (INDEPENDENT_AMBULATORY_CARE_PROVIDER_SITE_OTHER): Payer: Managed Care, Other (non HMO) | Admitting: Adult Health

## 2013-02-14 VITALS — BP 150/86 | Ht 65.0 in | Wt 244.0 lb

## 2013-02-14 DIAGNOSIS — N898 Other specified noninflammatory disorders of vagina: Secondary | ICD-10-CM

## 2013-02-14 DIAGNOSIS — N949 Unspecified condition associated with female genital organs and menstrual cycle: Secondary | ICD-10-CM

## 2013-02-14 HISTORY — DX: Unspecified condition associated with female genital organs and menstrual cycle: N94.9

## 2013-02-14 HISTORY — DX: Other specified noninflammatory disorders of vagina: N89.8

## 2013-02-14 LAB — COMPREHENSIVE METABOLIC PANEL
ALT: 21 U/L (ref 0–35)
AST: 20 U/L (ref 0–37)
Albumin: 4.2 g/dL (ref 3.5–5.2)
Alkaline Phosphatase: 66 U/L (ref 39–117)
BILIRUBIN TOTAL: 0.4 mg/dL (ref 0.3–1.2)
BUN: 10 mg/dL (ref 6–23)
CALCIUM: 9.1 mg/dL (ref 8.4–10.5)
CHLORIDE: 101 meq/L (ref 96–112)
CO2: 25 meq/L (ref 19–32)
Creat: 0.68 mg/dL (ref 0.50–1.10)
GLUCOSE: 110 mg/dL — AB (ref 70–99)
Potassium: 3.9 mEq/L (ref 3.5–5.3)
SODIUM: 135 meq/L (ref 135–145)
TOTAL PROTEIN: 6.9 g/dL (ref 6.0–8.3)

## 2013-02-14 LAB — POCT URINALYSIS DIPSTICK
Blood, UA: NEGATIVE
Glucose, UA: NEGATIVE
LEUKOCYTES UA: NEGATIVE
NITRITE UA: NEGATIVE
Protein, UA: NEGATIVE

## 2013-02-14 LAB — POCT WET PREP (WET MOUNT)
Trichomonas Wet Prep HPF POC: NEGATIVE
WBC, Wet Prep HPF POC: NEGATIVE

## 2013-02-14 LAB — CBC
HCT: 37.7 % (ref 36.0–46.0)
HEMOGLOBIN: 13 g/dL (ref 12.0–15.0)
MCH: 28.6 pg (ref 26.0–34.0)
MCHC: 34.5 g/dL (ref 30.0–36.0)
MCV: 82.9 fL (ref 78.0–100.0)
PLATELETS: 329 10*3/uL (ref 150–400)
RBC: 4.55 MIL/uL (ref 3.87–5.11)
RDW: 14 % (ref 11.5–15.5)
WBC: 7.6 10*3/uL (ref 4.0–10.5)

## 2013-02-14 LAB — SEDIMENTATION RATE: SED RATE: 8 mm/h (ref 0–22)

## 2013-02-14 LAB — TSH: TSH: 1.353 u[IU]/mL (ref 0.350–4.500)

## 2013-02-14 NOTE — Progress Notes (Signed)
Subjective:     Patient ID: Amanda Blackwell, female   DOB: [birth year], 38 y.o.   MRN: [id number]  HPI Amanda Blackwell is a 38 year old white female married, in complaining of pelvic pain that comes and goes, for 3 weeks now,somtimes it shots to vagina and sex has hurt at times, she denies any nausea,vomiting or diarrhea or constipation.She was treated last week with Cipro for UTI from [facility] has some chronic OAB and some SUI, but stopped the meds.She is on Velivet for birth control.  Review of Systems See HPI Reviewed past medical,surgical, social and family history. Reviewed medications and allergies.     Objective:   Physical Exam BP 150/86  Ht '5\' 5"'  (1.651 m)  Wt 244 lb (110.678 kg)  BMI 40.60 kg/m2  LMP 01/23/2013   urine dipstick negative, Skin warm and dry.Pelvic: external genitalia is normal in appearance, vagina: white discharge without odor, cervix:smooth and bulbous, uterus: normal size, shape and contour, mildly tender, no masses felt, adnexa: no masses, has bilateral tenderness right > left tenderness, no rebound tenderness noted. Wet prep: negative.US performed in office. Uterus 6.1 x 3.8 x 3.2 cm, anteverted uterus no myometrial masses noted within, slightly tender to palp with vaginal probe  Endometrium 3.4 mm, symmetrical,  Right ovary 2.1 x 1.3 x 1.1 cm, pt c/o more tenderness noted with palp with vaginal probe  Left ovary 1.9 x 1.0 x 1.0 cm, slightly tender to palp with vaginal probe  No free fluid or adnexal masses noted within pelvis  Post Void Residual noted of 17.8cc  Technician Comments:  Anteverted uterus, Endometrium-3.56m symmetrical, Bilateral adnexa/ovaries appears wnl no free fluid or adnexal masses noted within pelvis, PVR noted of 17.8cc, pt c/o tenderness with palp with vaginal probe and noted that Rt ovary was more tender during exam.     Assessment:    Pelvic pain  Vaginal discharge    Plan:     Check CBC,CMP,TSH and ESR   Return in 2 days for  follow up Review handout on pelvic pain If increase of pain or fever or vomiting call or go to ER

## 2013-02-14 NOTE — Patient Instructions (Signed)
Pelvic Pain, Female Female pelvic pain can be caused by many different things and start from a variety of places. Pelvic pain refers to pain that is located in the lower half of the abdomen and between your hips. The pain may occur over a short period of time (acute) or may be reoccurring (chronic). The cause of pelvic pain may be related to disorders affecting the female reproductive organs (gynecologic), but it may also be related to the bladder, kidney stones, an intestinal complication, or muscle or skeletal problems. Getting help right away for pelvic pain is important, especially if there has been severe, sharp, or a sudden onset of unusual pain. It is also important to get help right away because some types of pelvic pain can be life threatening.  CAUSES  Below are only some of the causes of pelvic pain. The causes of pelvic pain can be in one of several categories.   Gynecologic.  Pelvic inflammatory disease.  Sexually transmitted infection.  Ovarian cyst or a twisted ovarian ligament (ovarian torsion).  Uterine lining that grows outside the uterus (endometriosis).  Fibroids, cysts, or tumors.  Ovulation.  Pregnancy.  Pregnancy that occurs outside the uterus (ectopic pregnancy).  Miscarriage.  Labor.  Abruption of the placenta or ruptured uterus.  Infection.  Uterine infection (endometritis).  Bladder infection.  Diverticulitis.  Miscarriage related to a uterine infection (septic abortion).  Bladder.  Inflammation of the bladder (cystitis).  Kidney stone(s).  Gastrointenstinal.  Constipation.  Diverticulitis.  Neurologic.  Trauma.  Feeling pelvic pain because of mental or emotional causes (psychosomatic).  Cancers of the bowel or pelvis. EVALUATION  Your caregiver will want to take a careful history of your concerns. This includes recent changes in your health, a careful gynecologic history of your periods (menses), and a sexual history. Obtaining  your family history and medical history is also important. Your caregiver may suggest a pelvic exam. A pelvic exam will help identify the location and severity of the pain. It also helps in the evaluation of which organ system may be involved. In order to identify the cause of the pelvic pain and be properly treated, your caregiver may order tests. These tests may include:   A pregnancy test.  Pelvic ultrasonography.  An X-ray exam of the abdomen.  A urinalysis or evaluation of vaginal discharge.  Blood tests. HOME CARE INSTRUCTIONS   Only take over-the-counter or prescription medicines for pain, discomfort, or fever as directed by your caregiver.   Rest as directed by your caregiver.   Eat a balanced diet.   Drink enough fluids to make your urine clear or pale yellow, or as directed.   Avoid sexual intercourse if it causes pain.   Apply warm or cold compresses to the lower abdomen depending on which one helps the pain.   Avoid stressful situations.   Keep a journal of your pelvic pain. Write down when it started, where the pain is located, and if there are things that seem to be associated with the pain, such as food or your menstrual cycle.  Follow up with your caregiver as directed.  SEEK MEDICAL CARE IF:  Your medicine does not help your pain.  You have abnormal vaginal discharge. SEEK IMMEDIATE MEDICAL CARE IF:   You have heavy bleeding from the vagina.   Your pelvic pain increases.   You feel lightheaded or faint.   You have chills.   You have pain with urination or blood in your urine.   You have uncontrolled  diarrhea or vomiting.   You have a fever or persistent symptoms for more than 3 days.  You have a fever and your symptoms suddenly get worse.   You are being physically or sexually abused.  MAKE SURE YOU:  Understand these instructions.  Will watch your condition.  Will get help if you are not doing well or get worse. Document  Released: 12/09/2003 Document Revised: 07/13/2011 Document Reviewed: 05/03/2011 [facility] Patient Information 2014 [place], [place]. Follow up labs Return in 2 days for follow up If pain increases, vomiting or fever call or go to ER

## 2013-02-14 NOTE — Addendum Note (Signed)
Addended by: Derrek Monaco A on: 02/14/2013 10:35 AM   Modules accepted: Level of Service

## 2013-02-15 ENCOUNTER — Telehealth: Payer: Self-pay | Admitting: Adult Health

## 2013-02-15 NOTE — Telephone Encounter (Signed)
Pt feeling better and aware labs normal

## 2013-02-16 ENCOUNTER — Ambulatory Visit (INDEPENDENT_AMBULATORY_CARE_PROVIDER_SITE_OTHER): Payer: Managed Care, Other (non HMO) | Admitting: Adult Health

## 2013-02-16 ENCOUNTER — Encounter: Payer: Self-pay | Admitting: Adult Health

## 2013-02-16 VITALS — BP 112/84 | Ht 65.0 in | Wt 244.0 lb

## 2013-02-16 DIAGNOSIS — N318 Other neuromuscular dysfunction of bladder: Secondary | ICD-10-CM

## 2013-02-16 DIAGNOSIS — N949 Unspecified condition associated with female genital organs and menstrual cycle: Secondary | ICD-10-CM

## 2013-02-16 DIAGNOSIS — N3281 Overactive bladder: Secondary | ICD-10-CM

## 2013-02-16 HISTORY — DX: Overactive bladder: N32.81

## 2013-02-16 MED ORDER — MIRABEGRON ER 25 MG PO TB24: 25.0000 mg | ORAL_TABLET | Freq: Every day | ORAL | Status: DC

## 2013-02-16 NOTE — Patient Instructions (Signed)
Overactive Bladder, Adult The bladder has two functions that are totally opposite of the other. One is to relax and stretch out so it can store urine (fills like a balloon), and the other is to contract and squeeze down so that it can empty the urine that it has stored. Proper functioning of the bladder is a complex mixing of these two functions. The filling and emptying of the bladder can be influenced by:  The bladder.  The spinal cord.  The brain.  The nerves going to the bladder.  Other organs that are closely related to the bladder such as prostate in males and the vagina in females. As your bladder fills with urine, nerve signals are sent from the bladder to the brain to tell you that you may need to urinate. Normal urination requires that the bladder squeeze down with sufficient strength to empty the bladder, but this also requires that the bladder squeeze down sufficiently long to finish the job. In addition the sphincter muscles, which normally keep you from leaking urine, must also relax so that the urine can pass. Coordination between the bladder muscle squeezing down and the sphincter muscles relaxing is required to make everything happen normally. With an overactive bladder sometimes the muscles of the bladder contract unexpectedly and involuntarily and this causes an urgent need to urinate. The normal response is to try to hold urine in by contracting the sphincter muscles. Sometimes the bladder contracts so strongly that the sphincter muscles cannot stop the urine from passing out and incontinence occurs. This kind of incontinence is called urge incontinence. Having an overactive bladder can be embarrassing and awkward. It can keep you from living life the way you want to. Many people think it is just something you have to put up with as you grow older or have certain health conditions. In fact, there are treatments that can help make your life easier and more pleasant. CAUSES  Many  things can cause an overactive bladder. Possibilities include:  Urinary tract infection or infection of nearby tissues such as the prostate.  Prostate enlargement.  In women, multiple pregnancies or surgery on the uterus or urethra.  Bladder stones, inflammation or tumors.  Caffeine.  Alcohol.  Medications. For example, diuretics (drugs that help the body get rid of extra fluid) increase urine production. Some other medicines must be taken with lots of fluids.  Muscle or nerve weakness. This might be the result of a spinal cord injury, a stroke, multiple sclerosis or Parkinson's disease.  Diabetes can cause a high urine volume which fills the bladder so quickly that the normal urge to urinate is triggered very strongly. SYMPTOMS   Loss of bladder control. You feel the need to urinate and cannot make your body wait.  Sudden, strong urges to urinate.  Urinating 8 or more times a day.  Waking up to urinate two or more times a night. DIAGNOSIS  To decide if you have overactive bladder, your healthcare provider will probably:  Ask about symptoms you have noticed.  Ask about your overall health. This will include questions about any medications you are taking.  Do a physical examination. This will help determine if there are obvious blockages or other problems.  Order some tests. These might include:  A blood test to check for diabetes or other health issues that could be contributing to the problem.  Urine testing. This could measure the flow of urine and the pressure on the bladder.  A test of your neurological   system (the brain, spinal cord and nerves). This is the system that senses the need to urinate. Some of these tests are called flow tests, bladder pressure tests and electrical measurements of the sphincter muscle.  A bladder test to check whether it is emptying completely when you urinate.  Cytoscopy. This test uses a thin tube with a tiny camera on it. It offers a  look inside your urethra and bladder to see if there are problems.  Imaging tests. You might be given a contrast dye and then asked to urinate. X-rays are taken to see how your bladder is working. TREATMENT  An overactive bladder can be treated in many ways. The treatment will depend on the cause. Whether you have a mild or severe case also makes a difference. Often, treatment can be given in your healthcare provider's office or clinic. Be sure to discuss the different options with your caregiver. They include:  Behavioral treatments. These do not involve medication or surgery:  Bladder training. For this, you would follow a schedule to urinate at regular intervals. This helps you learn to control the urge to urinate. At first, you might be asked to wait a few minutes after feeling the urge. In time, you should be able to schedule bathroom visits an hour or more apart.  Kegel exercises. These exercises strengthen the pelvic floor muscles, which support the bladder. By toning these muscles, they can help control urination, even if the bladder muscles are overactive. A specialist will teach you how to do these exercises correctly. They will require daily practice.  Weight loss. If you are obese or overweight, losing weight might stop your bladder from being overactive. Talk to your healthcare provider about how many pounds you should lose. Also ask if there is a specific program or method that would work best for you.  Diet change. This might be suggested if constipation is making your overactive bladder worse. Your healthcare provider or a nutritionist can explain ways to change what you eat to ease constipation. Other people might need to take in less caffeine or alcohol. Sometimes drinking fewer fluids is needed, too.  Protection. This is not an actual treatment. But, you could wear special pads to take care of any leakage while you wait for other treatments to take effect. This will help you avoid  embarrassment.  Physical treatments.  Electrical stimulation. Electrodes will send gentle pulses to the nerves or muscles that help control the bladder. The goal is to strengthen them. Sometimes this is done with the electrodes outside of the body. Or, they might be placed inside the body (implanted). This treatment can take several months to have an effect.  Medications. These are usually used along with other treatments. Several medicines are available. Some are injected into the muscles involved in urination. Others come in pill form. Medications sometimes prescribed include:  Anticholinergics. These drugs block the signals that the nerves deliver to the bladder. This keeps it from releasing urine at the wrong time. Researchers think the drugs might help in other ways, too.  Imipramine. This is an antidepressant. But, it relaxes bladder muscles.  Botox. This is still experimental. Some people believe that injecting it into the bladder muscles will relax them so they work more normally. It has also been injected into the sphincter muscle when the sphincter muscle does not open properly. This is a temporary fix, however. Also, it might make matters worse, especially in older people.  Surgery.  A device might be implanted  to help manage your nerves. It works on the nerves that signal when you need to urinate.  Surgery is sometimes needed with electrical stimulation. If the electrodes are implanted, this is done through surgery.  Sometimes repairs need to be made through surgery. For example, the size of the bladder can be changed. This is usually done in severe cases only. HOME CARE INSTRUCTIONS   Take any medications your healthcare provider prescribed or suggested. Follow the directions carefully.  Practice any lifestyle changes that are recommended. These might include:  Drinking less fluid or drinking at different times of the day. If you need to urinate often during the night, for  example, you may need to stop drinking fluids early in the evening.  Cutting down on caffeine or alcohol. They can both make an overactive bladder worse. Caffeine is found in coffee, tea and sodas.  Doing Kegel exercises to strengthen muscles.  Losing weight, if that is recommended.  Eating a healthy and balanced diet. This will help you avoid constipation.  Keep a journal or a log. You might be asked to record how much you drink and when, and also when you feel the need to urinate.  Learn how to care for implants or other devices, such as pessaries. SEEK MEDICAL CARE IF:   Your overactive bladder gets worse.  You feel increased pain or irritation when you urinate.  You notice blood in your urine.  You have questions about any medications or devices that your healthcare provider recommended.  You notice blood, pus or swelling at the site of any test or treatment procedure.  You have an oral temperature above 102 F (38.9 C). SEEK IMMEDIATE MEDICAL CARE IF:  You have an oral temperature above 102 F (38.9 C), not controlled by medicine. Document Released: 11/07/2008 Document Revised: 04/05/2011 Document Reviewed: 11/07/2008 [facility] Patient Information 2014 [place]. Trial myrbetriq Call prn

## 2013-02-16 NOTE — Progress Notes (Signed)
Subjective:     Patient ID: Amanda Blackwell, female   DOB: Jun 25, 1975, 38 y.o.   MRN: [id number]  HPI Amanda Blackwell is a 38 year old white female back in follow up of pain that comes in goes in pelvic area, was good yesterday, but today has pain RLQ, labs were normal 2 days ago as was pelvic US, still no nausea,vomiting or bowel changes, period due,has used advil with relief.Does complain of OAB, up at night and some SUI, and tired oxybutin without relief.  Review of Systems See HPI Reviewed past medical,surgical, social and family history. Reviewed medications and allergies.     Objective:   Physical Exam BP 112/84  Ht 5\' 5"  (1.651 m)  Wt 244 lb (110.678 kg)  BMI 40.60 kg/m2  LMP 01/23/2013   talk only see HPI, will try myrbetriq, and use advil regularly Assessment:     OAB Pelvic pain    Plan:     Try myrbetriq Number of samples 28 tabs  Lot number [id number]   Exp date 10/16   Review handout on OAB Take 600 mg advil every 8 hours regularly and if still has pain Monday, call back and will get Dr Elonda Husky to assess, will discuss today with him. Keep diet bland and push fluids

## 2013-03-16 ENCOUNTER — Telehealth: Payer: Self-pay

## 2013-03-16 MED ORDER — MIRABEGRON ER 25 MG PO TB24: 25.0000 mg | ORAL_TABLET | Freq: Every day | ORAL | Status: DC

## 2013-03-16 NOTE — Telephone Encounter (Signed)
Spoke with pt. The Myrbetriq 25 mg really helped and she wants a Rx sent to [facility] in [place]. Thanks!!!

## 2013-03-16 NOTE — Telephone Encounter (Signed)
Pt aware meds refilled. °

## 2013-07-18 ENCOUNTER — Other Ambulatory Visit: Payer: Self-pay | Admitting: Adult Health

## 2013-08-05 ENCOUNTER — Other Ambulatory Visit: Payer: Self-pay | Admitting: Obstetrics & Gynecology

## 2013-09-11 ENCOUNTER — Encounter: Payer: Self-pay | Admitting: Adult Health

## 2013-09-11 ENCOUNTER — Ambulatory Visit (INDEPENDENT_AMBULATORY_CARE_PROVIDER_SITE_OTHER): Payer: Managed Care, Other (non HMO) | Admitting: Adult Health

## 2013-09-11 VITALS — BP 112/70 | HR 78 | Ht 65.0 in | Wt 249.5 lb

## 2013-09-11 DIAGNOSIS — Z3041 Encounter for surveillance of contraceptive pills: Secondary | ICD-10-CM

## 2013-09-11 DIAGNOSIS — N3281 Overactive bladder: Secondary | ICD-10-CM

## 2013-09-11 DIAGNOSIS — F419 Anxiety disorder, unspecified: Secondary | ICD-10-CM | POA: Insufficient documentation

## 2013-09-11 DIAGNOSIS — Z309 Encounter for contraceptive management, unspecified: Secondary | ICD-10-CM | POA: Insufficient documentation

## 2013-09-11 DIAGNOSIS — Z01419 Encounter for gynecological examination (general) (routine) without abnormal findings: Secondary | ICD-10-CM

## 2013-09-11 HISTORY — DX: Encounter for contraceptive management, unspecified: Z30.9

## 2013-09-11 MED ORDER — TOLTERODINE TARTRATE ER 4 MG PO CP24: 4.0000 mg | ORAL_CAPSULE | Freq: Every day | ORAL | Status: DC

## 2013-09-11 MED ORDER — DESOGEST-ETH ESTRAD TRIPHASIC 0.1/0.125/0.15 -0.025 MG PO TABS: ORAL_TABLET | ORAL | Status: DC

## 2013-09-11 MED ORDER — BUSPIRONE HCL 5 MG PO TABS: 5.0000 mg | ORAL_TABLET | Freq: Two times a day (BID) | ORAL | Status: DC

## 2013-09-11 MED ORDER — BUPROPION HCL ER (XL) 150 MG PO TB24: ORAL_TABLET | ORAL | Status: DC

## 2013-09-11 NOTE — Progress Notes (Signed)
Patient ID: Amanda Blackwell, female   DOB: January 14, 1976, 38 y.o.   MRN: [id number] History of Present Illness: Amanda Blackwell is a 38 year old white female,married,in for a physical.She had a normal pap with negative HPV 09/08/12.She says her OAB is fairly controlled on myrbetriq but if she take her Prilosec with it, her anxiety increases.Happ with her pills.   Current Medications, Allergies, Past Medical History, Past Surgical History, Family History and Social History were reviewed in [employer] record.     Review of Systems: Patient denies any headaches, blurred vision, shortness of breath, chest pain, abdominal pain, problems with bowel movements, urination, or intercourse. No joint pain, has increase in anxiety.Denies any suicidal ideations.Has had more reflux, see HPI.    Physical Exam:BP 112/70  Pulse 78  Ht 5\' 5"  (1.651 m)  Wt 249 lb 8 oz (113.172 kg)  BMI 41.52 kg/m2  LMP 08/31/2013 General:  Well developed, well nourished, no acute distress Skin:  Warm and dry Neck:  Midline trachea, normal thyroid Lungs; Clear to auscultation bilaterally Breast:  No dominant palpable mass, retraction, or nipple discharge Cardiovascular: Regular rate and rhythm Abdomen:  Soft, non tender, no hepatosplenomegaly Pelvic:  External genitalia is normal in appearance.  The vagina is normal in appearance.  The cervix is smooth.  Uterus is felt to be normal size, shape, and contour. Uterus mildly tender on palpation.No  adnexal masses or tenderness noted. Extremities:  No swelling or varicosities noted Psych:  Alert and cooperative,seems happy but says her anxiety has increased. Discussed trying buspar for anxiety an stopping myrbetriq and trying detrol la and go back on prilosec. Impression: Yearly gyn exam no pap OAB Anxiety  Contraceptive management    Plan: Stop myrbetirq Rx Detrol La 4 mg #30 1 daily with 11 refills Rx buspar 5 mg #60 1 bid with 11 refills Refilled  wellbutrin 150 mg #90 take 3 daily with 4 refills Refilled caziant 1 daily x 1 year Physical in 1 year Mammogram yearly  Call in follow up in about 1 month Review handout on anxiety

## 2013-09-11 NOTE — Patient Instructions (Signed)
Generalized Anxiety Disorder Generalized anxiety disorder (GAD) is a mental disorder. It interferes with life functions, including relationships, work, and school. GAD is different from normal anxiety, which everyone experiences at some point in their lives in response to specific life events and activities. Normal anxiety actually helps [place] prepare for and get through these life events and activities. Normal anxiety goes away after the event or activity is over.  GAD causes anxiety that is not necessarily related to specific events or activities. It also causes excess anxiety in proportion to specific events or activities. The anxiety associated with GAD is also difficult to control. GAD can vary from mild to severe. People with severe GAD can have intense waves of anxiety with physical symptoms (panic attacks).  SYMPTOMS The anxiety and worry associated with GAD are difficult to control. This anxiety and worry are related to many life events and activities and also occur more days than not for 6 months or longer. People with GAD also have three or more of the following symptoms (one or more in children):  Restlessness.   Fatigue.  Difficulty concentrating.   Irritability.  Muscle tension.  Difficulty sleeping or unsatisfying sleep. DIAGNOSIS GAD is diagnosed through an assessment by your health care provider. Your health care provider will ask you questions aboutyour mood,physical symptoms, and events in your life. Your health care provider may ask you about your medical history and use of alcohol or drugs, including prescription medicines. Your health care provider may also do a physical exam and blood tests. Certain medical conditions and the use of certain substances can cause symptoms similar to those associated with GAD. Your health care provider may refer you to a mental health specialist for further evaluation. TREATMENT The following therapies are usually used to treat GAD:    Medication. Antidepressant medication usually is prescribed for long-term daily control. Antianxiety medicines may be added in severe cases, especially when panic attacks occur.   Talk therapy (psychotherapy). Certain types of talk therapy can be helpful in treating GAD by providing support, education, and guidance. A form of talk therapy called cognitive behavioral therapy can teach you healthy ways to think about and react to daily life events and activities.  Stress managementtechniques. These include yoga, meditation, and exercise and can be very helpful when they are practiced regularly. A mental health specialist can help determine which treatment is best for you. Some people see improvement with one therapy. However, other people require a combination of therapies. Document Released: 05/08/2012 Document Revised: 05/28/2013 Document Reviewed: 05/08/2012 [facility] Patient Information 2015 [place], [place]. This information is not intended to replace advice given to you by your health care provider. Make sure you discuss any questions you have with your health care provider. Try buspar 1 bid Stop myrbetriq and try detrol la Physical in 1 year mammogram at 21

## 2013-09-23 ENCOUNTER — Other Ambulatory Visit: Payer: Self-pay | Admitting: Obstetrics & Gynecology

## 2013-11-08 ENCOUNTER — Telehealth: Payer: Self-pay | Admitting: Adult Health

## 2013-11-08 MED ORDER — BUSPIRONE HCL 7.5 MG PO TABS: 7.5000 mg | ORAL_TABLET | Freq: Three times a day (TID) | ORAL | Status: DC

## 2013-11-08 NOTE — Telephone Encounter (Signed)
Spoke with pt. Pt is on Buspar 5 mg BID. Pt states she finds herself getting jittery when it wears off. Wants Buspar increased if possible. Thanks!! Lyman

## 2013-11-08 NOTE — Telephone Encounter (Signed)
Will increase buspar to 7.5 mg 1 bid

## 2013-11-26 ENCOUNTER — Encounter: Payer: Self-pay | Admitting: Adult Health

## 2014-03-07 ENCOUNTER — Other Ambulatory Visit: Payer: Self-pay | Admitting: Adult Health

## 2014-04-14 ENCOUNTER — Other Ambulatory Visit: Payer: Self-pay | Admitting: Adult Health

## 2014-05-02 ENCOUNTER — Ambulatory Visit (HOSPITAL_COMMUNITY)
Admission: RE | Admit: 2014-05-02 | Discharge: 2014-05-02 | Disposition: A | Discharge status: Home / Self Care | Payer: Managed Care, Other (non HMO) | Source: Ambulatory Visit | Attending: Family Medicine | Admitting: Family Medicine

## 2014-05-02 ENCOUNTER — Other Ambulatory Visit (HOSPITAL_COMMUNITY): Payer: Self-pay | Admitting: Family Medicine

## 2014-05-02 DIAGNOSIS — R05 Cough: Secondary | ICD-10-CM | POA: Insufficient documentation

## 2014-05-02 DIAGNOSIS — R0789 Other chest pain: Secondary | ICD-10-CM | POA: Diagnosis not present

## 2014-05-02 DIAGNOSIS — J209 Acute bronchitis, unspecified: Secondary | ICD-10-CM

## 2014-05-02 DIAGNOSIS — J029 Acute pharyngitis, unspecified: Secondary | ICD-10-CM

## 2014-05-02 DIAGNOSIS — R509 Fever, unspecified: Secondary | ICD-10-CM | POA: Diagnosis not present

## 2014-06-19 ENCOUNTER — Other Ambulatory Visit: Payer: Self-pay | Admitting: *Deleted

## 2014-06-19 MED ORDER — BUPROPION HCL ER (XL) 150 MG PO TB24: 450.0000 mg | ORAL_TABLET | Freq: Every morning | ORAL | Status: DC

## 2014-08-17 ENCOUNTER — Other Ambulatory Visit: Payer: Self-pay | Admitting: Adult Health

## 2014-08-19 ENCOUNTER — Other Ambulatory Visit: Payer: Self-pay | Admitting: Adult Health

## 2014-09-17 ENCOUNTER — Other Ambulatory Visit: Payer: Managed Care, Other (non HMO) | Admitting: Adult Health

## 2014-09-17 ENCOUNTER — Other Ambulatory Visit: Payer: Self-pay | Admitting: Adult Health

## 2014-09-24 ENCOUNTER — Other Ambulatory Visit: Payer: Managed Care, Other (non HMO) | Admitting: Adult Health

## 2014-10-04 ENCOUNTER — Other Ambulatory Visit: Payer: Managed Care, Other (non HMO) | Admitting: Adult Health

## 2014-10-17 ENCOUNTER — Other Ambulatory Visit: Payer: Self-pay | Admitting: Adult Health

## 2014-11-14 ENCOUNTER — Other Ambulatory Visit: Payer: Self-pay | Admitting: Adult Health

## 2014-12-22 ENCOUNTER — Other Ambulatory Visit: Payer: Self-pay | Admitting: Adult Health

## 2015-01-01 ENCOUNTER — Other Ambulatory Visit: Payer: Self-pay | Admitting: Adult Health

## 2015-01-03 ENCOUNTER — Encounter: Payer: Self-pay | Admitting: Adult Health

## 2015-01-03 ENCOUNTER — Ambulatory Visit (INDEPENDENT_AMBULATORY_CARE_PROVIDER_SITE_OTHER): Payer: Managed Care, Other (non HMO) | Admitting: Adult Health

## 2015-01-03 VITALS — BP 124/78 | HR 104 | Ht 65.0 in | Wt 243.0 lb

## 2015-01-03 DIAGNOSIS — R319 Hematuria, unspecified: Secondary | ICD-10-CM

## 2015-01-03 DIAGNOSIS — F419 Anxiety disorder, unspecified: Secondary | ICD-10-CM

## 2015-01-03 DIAGNOSIS — Z1212 Encounter for screening for malignant neoplasm of rectum: Secondary | ICD-10-CM | POA: Diagnosis not present

## 2015-01-03 DIAGNOSIS — Z01419 Encounter for gynecological examination (general) (routine) without abnormal findings: Secondary | ICD-10-CM

## 2015-01-03 DIAGNOSIS — N3281 Overactive bladder: Secondary | ICD-10-CM

## 2015-01-03 DIAGNOSIS — Z800 Family history of malignant neoplasm of digestive organs: Secondary | ICD-10-CM

## 2015-01-03 DIAGNOSIS — Z3041 Encounter for surveillance of contraceptive pills: Secondary | ICD-10-CM

## 2015-01-03 DIAGNOSIS — R109 Unspecified abdominal pain: Secondary | ICD-10-CM

## 2015-01-03 HISTORY — DX: Unspecified abdominal pain: R10.9

## 2015-01-03 HISTORY — DX: Family history of malignant neoplasm of digestive organs: Z80.0

## 2015-01-03 LAB — POCT URINALYSIS DIPSTICK
GLUCOSE UA: NEGATIVE
Leukocytes, UA: NEGATIVE
Nitrite, UA: NEGATIVE
Protein, UA: NEGATIVE

## 2015-01-03 LAB — HEMOCCULT GUIAC POC 1CARD (OFFICE): FECAL OCCULT BLD: NEGATIVE

## 2015-01-03 MED ORDER — DESOGEST-ETH ESTRAD TRIPHASIC 0.1/0.125/0.15 -0.025 MG PO TABS: 1.0000 | ORAL_TABLET | Freq: Every day | ORAL | Status: DC

## 2015-01-03 MED ORDER — TOLTERODINE TARTRATE ER 4 MG PO CP24: 4.0000 mg | ORAL_CAPSULE | Freq: Every day | ORAL | Status: DC

## 2015-01-03 MED ORDER — PHENAZOPYRIDINE HCL 200 MG PO TABS: 200.0000 mg | ORAL_TABLET | Freq: Three times a day (TID) | ORAL | Status: DC

## 2015-01-03 MED ORDER — BUPROPION HCL ER (XL) 150 MG PO TB24: 450.0000 mg | ORAL_TABLET | Freq: Every morning | ORAL | Status: DC

## 2015-01-03 NOTE — Progress Notes (Signed)
Patient ID: Amanda Blackwell, female   DOB: December 09, 1975, 39 y.o.   MRN: [id number] History of Present Illness: Amanda Blackwell is a 39 year old white female, married,G2P1 in for a well woman gyn exam,she had a normal pap with negative HPV 09/08/12.She is complaining of low stomach pain and peeing often.She is still taking detrol.She thinks she has IBS, may be constipated for days then will have diarrhea.Her Mom had colon cancer at 40 or so. PCP is Dr Hilma Favors.  Current Medications, Allergies, Past Medical History, Past Surgical History, Family History and Social History were reviewed in [employer] record.     Review of Systems: Patient denies any headaches, hearing loss, fatigue, blurred vision, shortness of breath, chest pain, problems with  or intercourse. No joint pain or mood swings.See HPI for positives. See HPI for positives.   Physical Exam:BP 124/78 mmHg  Pulse 104  Ht 5\' 5"  (1.651 m)  Wt 243 lb (110.224 kg)  BMI 40.44 kg/m2  LMP 12/13/2014 urine 1+ blood General:  Well developed, well nourished, no acute distress Skin:  Warm and dry Neck:  Midline trachea, normal thyroid, good ROM, no lymphadenopathy Lungs; Clear to auscultation bilaterally Breast:  No dominant palpable mass, retraction, or nipple discharge Cardiovascular: Regular rate and rhythm Abdomen:  Soft, non tender, no hepatosplenomegaly Pelvic:  External genitalia is normal in appearance, no lesions.  The vagina is normal in appearance. Urethra has no lesions or masses. The cervix is everted at os.  Uterus is felt to be normal size, shape, and contour.  No adnexal masses or tenderness noted.Bladder is mildly tender, no masses felt. Rectal: Good sphincter tone, no polyps, or hemorrhoids felt.  Hemoccult negative. Extremities/musculoskeletal:  No swelling or varicosities noted, no clubbing or cyanosis Psych:  No mood changes, alert and cooperative,seems happy Discussed trying pyridium to see if may have IC  symptoms.  Impression: Well woman gyn exam no pap Stomach pain OAB Contraceptive management Family history of colon cancer Hematuria  Anxiety     Plan: UA C&S sent Rx pyridium 200 mg take 1 tid #60 no refills Refilled wellbutrin 150 mg take 3 daily #30 with 6 refills Refilled cazian x 1 year, take 1 daily Refilled Detrol LA 4 mg #30 with 6 refills, take 1 daily Follow up in 4 weeks Get mammogram  Pap and physical in 1 year Referred to Dr Laural Golden for colonoscopy  Review handout on IC

## 2015-01-03 NOTE — Patient Instructions (Addendum)
Interstitial Cystitis Interstitial cystitis is a condition that causes inflammation of the bladder. The bladder is a hollow organ in the lower part of your abdomen. It stores urine after the urine is made by your kidneys. With interstitial cystitis, you may have pain in the bladder area. You may also have a frequent and urgent need to urinate. The severity of interstitial cystitis can vary from person to person. You may have flare-ups of the condition, and then it may go away for a while. For many people who have this condition, it becomes a long-term problem. CAUSES The cause of this condition is not known. RISK FACTORS This condition is more likely to develop in women. SYMPTOMS Symptoms of interstitial cystitis vary, and they can change over time. Symptoms may include:  Discomfort or pain in the bladder area. This can range from mild to severe. The pain may change in intensity as the bladder fills with urine or as it empties.  Pelvic pain.  An urgent need to urinate.  Frequent urination.  Pain during sexual intercourse.  Pinpoint bleeding on the bladder wall. For women, the symptoms often get worse during menstruation. DIAGNOSIS This condition is diagnosed by evaluating your symptoms and ruling out other causes. A physical exam will be done. Various tests may be done to rule out other conditions. Common tests include:  Urine tests.  Cystoscopy. In this test, a tool that is like a very thin telescope is used to look into your bladder.  Biopsy. This involves taking a sample of tissue from the bladder wall to be examined under a microscope. TREATMENT There is no cure for interstitial cystitis, but treatment methods are available to control your symptoms. Work closely with your health care provider to find the treatments that will be most effective for you. Treatment options may include:  Medicines to relieve pain and to help reduce the number of times that you feel the need to  urinate.  Bladder training. This involves learning ways to control when you urinate, such as:  Urinating at scheduled times.  Training yourself to delay urination.  Doing exercises (Kegel exercises) to strengthen the muscles that control urine flow.  Lifestyle changes, such as changing your diet or taking steps to control stress.  Use of a device that provides electrical stimulation in order to reduce pain.  A procedure that stretches your bladder by filling it with air or fluid.  Surgery. This is rare. It is only done for extreme cases if other treatments do not help. HOME CARE INSTRUCTIONS  Take medicines only as directed by your health care provider.  Use bladder training techniques as directed.  Keep a bladder diary to find out which foods, liquids, or activities make your symptoms worse.  Use your bladder diary to schedule bathroom trips. If you are away from home, plan to be near a bathroom at each of your scheduled times.  Make sure you urinate just before you leave the house and just before you go to bed.  Do Kegel exercises as directed by your health care provider.  Do not drink alcohol.  Do not use any tobacco products, including cigarettes, chewing tobacco, or electronic cigarettes. If you need help quitting, ask your health care provider.  Make dietary changes as directed by your health care provider. You may need to avoid spicy foods and foods that contain a high amount of potassium.  Limit your drinking of beverages that stimulate urination. These include soda, coffee, and tea.  Keep all follow-up   visits as directed by your health care provider. This is important. SEEK MEDICAL CARE IF:  Your symptoms do not get better after treatment.  Your pain and discomfort are getting worse.  You have more frequent urges to urinate.  You have a fever. SEEK IMMEDIATE MEDICAL CARE IF:  You are not able to control your bladder at all.   This information is not  intended to replace advice given to you by your health care provider. Make sure you discuss any questions you have with your health care provider.   Document Released: 09/12/2003 Document Revised: 02/01/2014 Document Reviewed: 09/18/2013 Elsevier Interactive Patient Education 2016 [place]. Take pyridium  Follow up in 4 weeks Get mammogram  Refereed Dr Laural Golden Pap and physical in 1 year

## 2015-01-04 LAB — URINE CULTURE

## 2015-01-04 LAB — URINALYSIS, ROUTINE W REFLEX MICROSCOPIC
BILIRUBIN UA: NEGATIVE
KETONES UA: NEGATIVE
LEUKOCYTES UA: NEGATIVE
NITRITE UA: NEGATIVE
Protein, UA: NEGATIVE
SPEC GRAV UA: 1.026 (ref 1.005–1.030)
Urobilinogen, Ur: 0.2 mg/dL (ref 0.2–1.0)
pH, UA: 6 (ref 5.0–7.5)

## 2015-01-04 LAB — MICROSCOPIC EXAMINATION: CASTS: NONE SEEN /LPF

## 2015-01-06 ENCOUNTER — Encounter (INDEPENDENT_AMBULATORY_CARE_PROVIDER_SITE_OTHER): Payer: Self-pay | Admitting: *Deleted

## 2015-01-08 ENCOUNTER — Telehealth: Payer: Self-pay | Admitting: Adult Health

## 2015-01-08 NOTE — Telephone Encounter (Signed)
Pt aware of urine 

## 2015-01-31 ENCOUNTER — Ambulatory Visit (INDEPENDENT_AMBULATORY_CARE_PROVIDER_SITE_OTHER): Payer: 59 | Admitting: Adult Health

## 2015-01-31 ENCOUNTER — Encounter: Payer: Self-pay | Admitting: Adult Health

## 2015-01-31 VITALS — BP 110/70 | HR 88 | Ht 65.0 in | Wt 241.5 lb

## 2015-01-31 DIAGNOSIS — N3010 Interstitial cystitis (chronic) without hematuria: Secondary | ICD-10-CM

## 2015-01-31 DIAGNOSIS — N3281 Overactive bladder: Secondary | ICD-10-CM

## 2015-01-31 HISTORY — DX: Interstitial cystitis (chronic) without hematuria: N30.10

## 2015-01-31 MED ORDER — PHENAZOPYRIDINE HCL 200 MG PO TABS: 200.0000 mg | ORAL_TABLET | Freq: Three times a day (TID) | ORAL | Status: DC

## 2015-01-31 NOTE — Progress Notes (Signed)
Subjective:     Patient ID: Amanda Blackwell, female   DOB: July 19, 1975, 40 y.o.   MRN: [id number]  HPI Amanda Blackwell is a 40 year old white female, married back in follow up of trying pyridium to see if helps with pain in bladder area and having to pee often, and she says she is better.  Review of Systems Patient denies any headaches, hearing loss, fatigue, blurred vision, shortness of breath, chest pain, abdominal pain, problems with bowel movements, or intercourse. No joint pain or mood swings.See HPI for positives. Reviewed past medical,surgical, social and family history. Reviewed medications and allergies.     Objective:   Physical Exam BP 110/70 mmHg  Pulse 88  Ht 5\' 5"  (1.651 m)  Wt 241 lb 8 oz (109.544 kg)  BMI 40.19 kg/m2  LMP 01/10/2015 Discussed that since is feeling better, that she probably does have IC, she says some days still hurts,but is better, will get appointment to see Dr Elonda Husky, face time 10 minutes, discussing options, like DMOS bladder irrigation or Elmiron.    Assessment:     IC  OAB    Plan:     Continue pyridium, refilled pyridium 200 mg 1 tid #60 Follow up next week with Dr Elonda Husky to discuss IC treatment options Review handout on IC

## 2015-01-31 NOTE — Patient Instructions (Signed)
Interstitial Cystitis Interstitial cystitis is a condition that causes inflammation of the bladder. The bladder is a hollow organ in the lower part of your abdomen. It stores urine after the urine is made by your kidneys. With interstitial cystitis, you may have pain in the bladder area. You may also have a frequent and urgent need to urinate. The severity of interstitial cystitis can vary from person to person. You may have flare-ups of the condition, and then it may go away for a while. For many people who have this condition, it becomes a long-term problem. CAUSES The cause of this condition is not known. RISK FACTORS This condition is more likely to develop in women. SYMPTOMS Symptoms of interstitial cystitis vary, and they can change over time. Symptoms may include:  Discomfort or pain in the bladder area. This can range from mild to severe. The pain may change in intensity as the bladder fills with urine or as it empties.  Pelvic pain.  An urgent need to urinate.  Frequent urination.  Pain during sexual intercourse.  Pinpoint bleeding on the bladder wall. For women, the symptoms often get worse during menstruation. DIAGNOSIS This condition is diagnosed by evaluating your symptoms and ruling out other causes. A physical exam will be done. Various tests may be done to rule out other conditions. Common tests include:  Urine tests.  Cystoscopy. In this test, a tool that is like a very thin telescope is used to look into your bladder.  Biopsy. This involves taking a sample of tissue from the bladder wall to be examined under a microscope. TREATMENT There is no cure for interstitial cystitis, but treatment methods are available to control your symptoms. Work closely with your health care provider to find the treatments that will be most effective for you. Treatment options may include:  Medicines to relieve pain and to help reduce the number of times that you feel the need to  urinate.  Bladder training. This involves learning ways to control when you urinate, such as:  Urinating at scheduled times.  Training yourself to delay urination.  Doing exercises (Kegel exercises) to strengthen the muscles that control urine flow.  Lifestyle changes, such as changing your diet or taking steps to control stress.  Use of a device that provides electrical stimulation in order to reduce pain.  A procedure that stretches your bladder by filling it with air or fluid.  Surgery. This is rare. It is only done for extreme cases if other treatments do not help. HOME CARE INSTRUCTIONS  Take medicines only as directed by your health care provider.  Use bladder training techniques as directed.  Keep a bladder diary to find out which foods, liquids, or activities make your symptoms worse.  Use your bladder diary to schedule bathroom trips. If you are away from home, plan to be near a bathroom at each of your scheduled times.  Make sure you urinate just before you leave the house and just before you go to bed.  Do Kegel exercises as directed by your health care provider.  Do not drink alcohol.  Do not use any tobacco products, including cigarettes, chewing tobacco, or electronic cigarettes. If you need help quitting, ask your health care provider.  Make dietary changes as directed by your health care provider. You may need to avoid spicy foods and foods that contain a high amount of potassium.  Limit your drinking of beverages that stimulate urination. These include soda, coffee, and tea.  Keep all follow-up   visits as directed by your health care provider. This is important. SEEK MEDICAL CARE IF:  Your symptoms do not get better after treatment.  Your pain and discomfort are getting worse.  You have more frequent urges to urinate.  You have a fever. SEEK IMMEDIATE MEDICAL CARE IF:  You are not able to control your bladder at all.   This information is not  intended to replace advice given to you by your health care provider. Make sure you discuss any questions you have with your health care provider.   Document Released: 09/12/2003 Document Revised: 02/01/2014 Document Reviewed: 09/18/2013 Elsevier Interactive Patient Education [employer]. Follow up in 1 week with Dr Elonda Husky

## 2015-02-06 ENCOUNTER — Ambulatory Visit (INDEPENDENT_AMBULATORY_CARE_PROVIDER_SITE_OTHER): Payer: 59 | Admitting: Obstetrics & Gynecology

## 2015-02-06 ENCOUNTER — Encounter: Payer: Self-pay | Admitting: Obstetrics & Gynecology

## 2015-02-06 VITALS — BP 120/80 | Ht 65.0 in | Wt 245.0 lb

## 2015-02-06 DIAGNOSIS — N3010 Interstitial cystitis (chronic) without hematuria: Secondary | ICD-10-CM

## 2015-02-06 MED ORDER — PENTOSAN POLYSULFATE SODIUM 100 MG PO CAPS: ORAL_CAPSULE | ORAL | Status: DC

## 2015-02-06 NOTE — Progress Notes (Signed)
Patient ID: Carola Lacroix, female   DOB: January 30, 1975, 40 y.o.   MRN: [id number]      Chief Complaint  Patient presents with  . Follow-up    IC per JAG     Blood pressure 120/80, height 5\' 5"  (1.651 m), weight 245 lb (111.131 kg), last menstrual period 01/10/2015.  40 y.o. G2P0011 Patient's last menstrual period was 01/10/2015. The current method of family planning is OCP (estrogen/progesterone).  Subjective Pt with increasing pelvic pain dysuria and discomfort over the past several months Has seen Derrek Monaco and tried on trial of bladder number with significant improvement As a result with rest of work up negative will proceed with empiric therapy for presumed IC  Objective   Pertinent ROS   Labs or studies     Impression Diagnoses this Encounter::   ICD-9-CM ICD-10-CM   1. Interstitial cystitis 595.1 N30.10     Established relevant diagnosis(es):   Plan/Recommendations: Meds ordered this encounter  Medications  . pentosan polysulfate (ELMIRON) 100 MG capsule    Sig: Take 2 tablets twice daily    Dispense:  120 capsule    Refill:  11    Labs or Scans Ordered: No orders of the defined types were placed in this encounter.    Management::   Follow up 1  weeks       Face to face time:  15 minutes  Greater than 50% of the visit time was spent in counseling and coordination of care with the patient.  The summary and outline of the counseling and care coordination is summarized in the note above.   All questions were answered.

## 2015-02-17 ENCOUNTER — Ambulatory Visit (INDEPENDENT_AMBULATORY_CARE_PROVIDER_SITE_OTHER): Payer: 59 | Admitting: Obstetrics & Gynecology

## 2015-02-17 ENCOUNTER — Encounter: Payer: Self-pay | Admitting: Obstetrics & Gynecology

## 2015-02-17 VITALS — BP 120/80 | HR 76 | Wt 244.0 lb

## 2015-02-17 DIAGNOSIS — N3010 Interstitial cystitis (chronic) without hematuria: Secondary | ICD-10-CM | POA: Diagnosis not present

## 2015-02-17 NOTE — Progress Notes (Signed)
Patient ID: Amanda Blackwell, female   DOB: [birth year], 40 y.o.   MRN: [id number] Diagnosed with IC: 2017  Current Meds:  Elmirorn, DMSO Dietary restrictions    Pt states her symptoms have been stable, no exacerbations, although not perfect Wants to continue on this cycle  Blood pressure 120/80, pulse 76, weight 244 lb (110.678 kg), last menstrual period 02/08/2015.    The external urethra meatus was prepped with betadine DMSO 50 cc was instilled in the usual fashion after the bladder was catheterized and emptied completely 50cc was instilled into the bladder without difficulty and the patient tolerated well She will refrain from voiding as long as possible  Follow up in 2 weeks, or as patient requests based on her symptom complex

## 2015-03-03 ENCOUNTER — Ambulatory Visit (INDEPENDENT_AMBULATORY_CARE_PROVIDER_SITE_OTHER): Payer: 59 | Admitting: Obstetrics & Gynecology

## 2015-03-03 ENCOUNTER — Encounter: Payer: Self-pay | Admitting: Obstetrics & Gynecology

## 2015-03-03 VITALS — BP 100/80 | HR 80 | Wt 243.0 lb

## 2015-03-03 DIAGNOSIS — N3010 Interstitial cystitis (chronic) without hematuria: Secondary | ICD-10-CM

## 2015-03-03 NOTE — Progress Notes (Signed)
Patient ID: Amanda Blackwell, female   DOB: 1975/12/16, 40 y.o.   MRN: [id number] Diagnosed with IC: 01/2015  Current Meds:  Elmiron, DMSO Dietary restrictions    Pt states her symptoms have been stable, no exacerbations, although not perfect Wants to continue on this cycle  Blood pressure 100/80, pulse 80, weight 243 lb (110.224 kg), last menstrual period 02/08/2015.    The external urethra meatus was prepped with betadine DMSO 50 cc was instilled in the usual fashion after the bladder was catheterized and emptied completely 50cc was instilled into the bladder without difficulty and the patient tolerated well She will refrain from voiding as long as possible  Follow up in 3 weeks, or as patient requests based on her symptom complex

## 2015-03-25 ENCOUNTER — Ambulatory Visit (INDEPENDENT_AMBULATORY_CARE_PROVIDER_SITE_OTHER): Payer: 59 | Admitting: Obstetrics & Gynecology

## 2015-03-25 ENCOUNTER — Encounter: Payer: Self-pay | Admitting: Obstetrics & Gynecology

## 2015-03-25 VITALS — BP 120/80 | HR 88 | Wt 246.0 lb

## 2015-03-25 DIAGNOSIS — N3010 Interstitial cystitis (chronic) without hematuria: Secondary | ICD-10-CM

## 2015-03-26 NOTE — Progress Notes (Signed)
Patient ID: Amanda Blackwell, female   DOB: [birth year], 40 y.o.   MRN: [id number] Diagnosed with IC: 12/2014  Current Meds:  Elmiron, DMSO Dietary restrictions    Pt states her symptoms have been stable, no exacerbations, although not perfect Wants to continue on this cycle  Blood pressure 120/80, pulse 88, weight 246 lb (111.585 kg), last menstrual period 03/08/2015.    The external urethra meatus was prepped with betadine DMSO 50 cc was instilled in the usual fashion after the bladder was catheterized and emptied completely 50cc was instilled into the bladder without difficulty and the patient tolerated well She will refrain from voiding as long as possible  Follow up in 3 weeks, or as patient requests based on her symptom complex

## 2015-04-15 ENCOUNTER — Encounter: Payer: Self-pay | Admitting: Obstetrics & Gynecology

## 2015-04-15 ENCOUNTER — Ambulatory Visit (INDEPENDENT_AMBULATORY_CARE_PROVIDER_SITE_OTHER): Payer: 59 | Admitting: Obstetrics & Gynecology

## 2015-04-15 VITALS — BP 110/80 | HR 110 | Wt 242.0 lb

## 2015-04-15 DIAGNOSIS — N3010 Interstitial cystitis (chronic) without hematuria: Secondary | ICD-10-CM

## 2015-04-15 NOTE — Progress Notes (Signed)
Patient ID: Amanda Blackwell, female   DOB: 1975-10-29, 40 y.o.   MRN: [id number] Patient ID: Amanda Blackwell, female   DOB: 02-12-1975, 40 y.o.   MRN: [id number] Diagnosed with IC: 12/2014  Current Meds:  Elmiron, DMSO Dietary restrictions    Pt states her symptoms have been stable, no exacerbations, although not perfect Wants to continue on this cycle  Patient's last menstrual period was 03/08/2015.     The external urethra meatus was prepped with betadine DMSO 50 cc was instilled in the usual fashion after the bladder was catheterized and emptied completely 50cc was instilled into the bladder without difficulty and the patient tolerated well She will refrain from voiding as long as possible  Follow up in 4 weeks, or as patient requests based on her symptom complex

## 2015-04-15 NOTE — Addendum Note (Signed)
Addended by: Diona Fanti A on: 04/15/2015 04:28 PM   Modules accepted: Medications

## 2015-05-15 ENCOUNTER — Encounter: Payer: Self-pay | Admitting: Obstetrics & Gynecology

## 2015-05-15 ENCOUNTER — Ambulatory Visit (INDEPENDENT_AMBULATORY_CARE_PROVIDER_SITE_OTHER): Payer: 59 | Admitting: Obstetrics & Gynecology

## 2015-05-15 VITALS — BP 132/92 | HR 92 | Ht 65.5 in | Wt 248.0 lb

## 2015-05-15 DIAGNOSIS — N3010 Interstitial cystitis (chronic) without hematuria: Secondary | ICD-10-CM

## 2015-05-15 NOTE — Progress Notes (Signed)
Patient ID: Amanda Blackwell, female   DOB: 05-04-1975, 40 y.o.   MRN: [id number] Diagnosed with IC: 2017  Current Meds:  Elmiron, dmso Dietary restrictions    Pt states her symptoms have been stable, no exacerbations, although not perfect Wants to continue on this cycle  Blood pressure 132/92, pulse 92, height 5' 5.5" (1.664 m), weight 248 lb (112.492 kg), last menstrual period 05/02/2015.    The external urethra meatus was prepped with betadine DMSO 50 cc was instilled in the usual fashion after the bladder was catheterized and emptied completely 50cc was instilled into the bladder without difficulty and the patient tolerated well She will refrain from voiding as long as possible  Follow up in 5 weeks, or as patient requests based on her symptom complex

## 2015-06-19 ENCOUNTER — Ambulatory Visit (INDEPENDENT_AMBULATORY_CARE_PROVIDER_SITE_OTHER): Payer: 59 | Admitting: Obstetrics & Gynecology

## 2015-06-19 ENCOUNTER — Encounter: Payer: Self-pay | Admitting: Obstetrics & Gynecology

## 2015-06-19 VITALS — BP 110/80 | HR 92 | Ht 65.0 in | Wt 247.0 lb

## 2015-06-19 DIAGNOSIS — N3010 Interstitial cystitis (chronic) without hematuria: Secondary | ICD-10-CM | POA: Diagnosis not present

## 2015-06-19 NOTE — Progress Notes (Signed)
Patient ID: Amanda Blackwell, female   DOB: 1975-11-19, 40 y.o.   MRN: [id number] Patient ID: Amanda Blackwell, female   DOB: August 12, 1975, 39 y.o.   MRN: [id number] Diagnosed with IC: 2017  Current Meds:  Elmiron, dmso Dietary restrictions    Pt states her symptoms have been stable, no exacerbations, although not perfect Wants to continue on this cycle  Blood pressure 110/80, pulse 92, height 5\' 5"  (1.651 m), weight 247 lb (112.038 kg), last menstrual period 05/31/2015.    The external urethra meatus was prepped with betadine DMSO 50 cc was instilled in the usual fashion after the bladder was catheterized and emptied completely 50cc was instilled into the bladder without difficulty and the patient tolerated well She will refrain from voiding as long as possible  Follow up in 5 weeks, or as patient requests based on her symptom complex

## 2015-07-22 ENCOUNTER — Other Ambulatory Visit: Payer: Self-pay | Admitting: Obstetrics & Gynecology

## 2015-07-22 DIAGNOSIS — Z1231 Encounter for screening mammogram for malignant neoplasm of breast: Secondary | ICD-10-CM

## 2015-07-23 ENCOUNTER — Other Ambulatory Visit: Payer: Self-pay | Admitting: Adult Health

## 2015-07-24 ENCOUNTER — Ambulatory Visit: Payer: 59 | Admitting: Obstetrics & Gynecology

## 2015-08-06 ENCOUNTER — Other Ambulatory Visit: Payer: Self-pay | Admitting: Adult Health

## 2015-08-07 ENCOUNTER — Ambulatory Visit: Payer: Managed Care, Other (non HMO)

## 2015-08-13 ENCOUNTER — Ambulatory Visit
Admission: RE | Admit: 2015-08-13 | Discharge: 2015-08-13 | Disposition: A | Discharge status: Home / Self Care | Payer: 59 | Source: Ambulatory Visit | Attending: Obstetrics & Gynecology | Admitting: Obstetrics & Gynecology

## 2015-08-13 DIAGNOSIS — Z1231 Encounter for screening mammogram for malignant neoplasm of breast: Secondary | ICD-10-CM

## 2015-08-14 ENCOUNTER — Ambulatory Visit (INDEPENDENT_AMBULATORY_CARE_PROVIDER_SITE_OTHER): Payer: 59 | Admitting: Obstetrics & Gynecology

## 2015-08-14 VITALS — BP 110/80 | HR 80 | Ht 65.2 in | Wt 251.0 lb

## 2015-08-14 DIAGNOSIS — N3010 Interstitial cystitis (chronic) without hematuria: Secondary | ICD-10-CM | POA: Diagnosis not present

## 2015-08-14 NOTE — Progress Notes (Signed)
Patient ID: Amanda Blackwell, female   DOB: October 15, 1975, 40 y.o.   MRN: [id number] Patient ID: Amanda Blackwell, female   DOB: December 21, 1975, 40 y.o.   MRN: [id number] Patient ID: Amanda Blackwell, female   DOB: 1975/06/29, 40 y.o.   MRN: [id number] Diagnosed with IC: 2017  Current Meds:  Elmiron, dmso Dietary restrictions    Pt states her symptoms have been stable, no exacerbations, although not perfect Wants to continue on this cycle  Blood pressure 110/80, pulse 80, height 5' 5.2" (1.656 m), weight 251 lb (113.853 kg), last menstrual period 07/26/2015.    The external urethra meatus was prepped with betadine DMSO 50 cc was instilled in the usual fashion after the bladder was catheterized and emptied completely 50cc was instilled into the bladder without difficulty and the patient tolerated well She will refrain from voiding as long as possible  Follow up in 5 weeks, or as patient requests based on her symptom complex

## 2015-08-21 ENCOUNTER — Other Ambulatory Visit: Payer: Self-pay | Admitting: Adult Health

## 2015-09-22 ENCOUNTER — Ambulatory Visit: Payer: 59 | Admitting: Obstetrics & Gynecology

## 2015-10-09 ENCOUNTER — Encounter: Payer: Self-pay | Admitting: Obstetrics & Gynecology

## 2015-10-09 ENCOUNTER — Ambulatory Visit (INDEPENDENT_AMBULATORY_CARE_PROVIDER_SITE_OTHER): Payer: 59 | Admitting: Obstetrics & Gynecology

## 2015-10-09 VITALS — BP 130/90 | HR 80 | Wt 246.0 lb

## 2015-10-09 DIAGNOSIS — N3010 Interstitial cystitis (chronic) without hematuria: Secondary | ICD-10-CM | POA: Diagnosis not present

## 2015-10-09 MED ORDER — ALPRAZOLAM 0.5 MG PO TABS
0.5000 mg | ORAL_TABLET | Freq: Every evening | ORAL | 5 refills | Status: DC | PRN
Start: 2015-10-09 — End: 2016-06-27

## 2015-10-09 NOTE — Progress Notes (Signed)
Diagnosed with IC: 2016  Current Meds:  Elmiron, DMSO Dietary restrictions    Pt states her symptoms have been stable, no exacerbations, although not perfect Wants to continue on this cycle  Blood pressure 130/90, pulse 80, weight 246 lb (111.6 kg), last menstrual period 09/19/2015.    The external urethra meatus was prepped with betadine DMSO 50 cc was instilled in the usual fashion after the bladder was catheterized and emptied completely 50cc was instilled into the bladder without difficulty and the patient tolerated well She will refrain from voiding as long as possible  Follow up in 6 weeks, or as patient requests based on her symptom complex

## 2015-11-10 ENCOUNTER — Other Ambulatory Visit: Payer: Self-pay | Admitting: Adult Health

## 2015-11-14 ENCOUNTER — Ambulatory Visit: Payer: 59 | Admitting: Obstetrics & Gynecology

## 2015-11-18 ENCOUNTER — Encounter: Payer: Self-pay | Admitting: Obstetrics & Gynecology

## 2015-11-18 ENCOUNTER — Ambulatory Visit (INDEPENDENT_AMBULATORY_CARE_PROVIDER_SITE_OTHER): Payer: 59 | Admitting: Obstetrics & Gynecology

## 2015-11-18 VITALS — BP 130/80 | HR 78 | Wt 242.0 lb

## 2015-11-18 DIAGNOSIS — N3010 Interstitial cystitis (chronic) without hematuria: Secondary | ICD-10-CM

## 2015-11-18 NOTE — Progress Notes (Signed)
Diagnosed with IC: 2017  Current Meds:  Elmiron, DMSO Dietary restrictions    Pt states her symptoms have been stable, no exacerbations, although not perfect Wants to continue on this cycle  Blood pressure 130/80, pulse 78, weight 242 lb (109.8 kg), last menstrual period 11/14/2015.    The external urethra meatus was prepped with betadine DMSO 50 cc was instilled in the usual fashion after the bladder was catheterized and emptied completely 50cc was instilled into the bladder without difficulty and the patient tolerated well She will refrain from voiding as long as possible  Follow up in 7 weeks, or as patient requests based on her symptom complex

## 2015-11-28 ENCOUNTER — Other Ambulatory Visit: Payer: Self-pay | Admitting: Adult Health

## 2015-12-10 ENCOUNTER — Other Ambulatory Visit: Payer: Self-pay | Admitting: Adult Health

## 2016-01-06 ENCOUNTER — Encounter: Payer: Self-pay | Admitting: Obstetrics & Gynecology

## 2016-01-06 ENCOUNTER — Ambulatory Visit (INDEPENDENT_AMBULATORY_CARE_PROVIDER_SITE_OTHER): Payer: 59 | Admitting: Obstetrics & Gynecology

## 2016-01-06 VITALS — BP 110/80 | HR 84 | Wt 243.4 lb

## 2016-01-06 DIAGNOSIS — N3010 Interstitial cystitis (chronic) without hematuria: Secondary | ICD-10-CM

## 2016-01-06 NOTE — Progress Notes (Signed)
Diagnosed with IC: 2017  Current Meds:  Elmiron, DMSO Dietary restrictions    Pt states her symptoms have been stable, no exacerbations, although not perfect Wants to continue on this cycle  Blood pressure 110/80, pulse 84, weight 243 lb 6.4 oz (110.4 kg).    The external urethra meatus was prepped with betadine DMSO 50 cc was instilled in the usual fashion after the bladder was catheterized and emptied completely 50cc was instilled into the bladder without difficulty and the patient tolerated well She will refrain from voiding as long as possible  Follow up in 8 weeks, or as patient requests based on her symptom complex

## 2016-02-12 ENCOUNTER — Other Ambulatory Visit: Payer: Self-pay | Admitting: Obstetrics & Gynecology

## 2016-03-04 ENCOUNTER — Ambulatory Visit (INDEPENDENT_AMBULATORY_CARE_PROVIDER_SITE_OTHER): Payer: 59 | Admitting: Obstetrics & Gynecology

## 2016-03-04 ENCOUNTER — Encounter: Payer: Self-pay | Admitting: Obstetrics & Gynecology

## 2016-03-04 VITALS — BP 124/84 | HR 88 | Wt 246.0 lb

## 2016-03-04 DIAGNOSIS — N3010 Interstitial cystitis (chronic) without hematuria: Secondary | ICD-10-CM | POA: Diagnosis not present

## 2016-03-04 NOTE — Progress Notes (Signed)
Diagnosed with IC: 2 years ago  Current Meds:  Elmiron, detrol la, DMSO Dietary restrictions    Pt states her symptoms have been stable, no exacerbations, although not perfect Wants to continue on this cycle  Blood pressure 124/84, pulse 88, weight 246 lb (111.6 kg), last menstrual period 02/07/2016.    The external urethra meatus was prepped with betadine DMSO 50 cc was instilled in the usual fashion after the bladder was catheterized and emptied completely 50cc was instilled into the bladder without difficulty and the patient tolerated well She will refrain from voiding as long as possible  Follow up in 8 weeks, or as patient requests based on her symptom complex

## 2016-03-06 ENCOUNTER — Other Ambulatory Visit: Payer: Self-pay | Admitting: Adult Health

## 2016-03-18 DIAGNOSIS — N3010 Interstitial cystitis (chronic) without hematuria: Secondary | ICD-10-CM | POA: Diagnosis not present

## 2016-03-18 DIAGNOSIS — N342 Other urethritis: Secondary | ICD-10-CM | POA: Diagnosis not present

## 2016-05-04 ENCOUNTER — Ambulatory Visit: Payer: 59 | Admitting: Obstetrics & Gynecology

## 2016-05-14 ENCOUNTER — Ambulatory Visit: Payer: 59 | Admitting: Obstetrics & Gynecology

## 2016-05-28 ENCOUNTER — Ambulatory Visit: Payer: 59 | Admitting: Obstetrics & Gynecology

## 2016-06-08 DIAGNOSIS — Z1389 Encounter for screening for other disorder: Secondary | ICD-10-CM | POA: Diagnosis not present

## 2016-06-08 DIAGNOSIS — M70971 Unspecified soft tissue disorder related to use, overuse and pressure, right ankle and foot: Secondary | ICD-10-CM | POA: Diagnosis not present

## 2016-06-17 ENCOUNTER — Ambulatory Visit: Payer: 59 | Admitting: Obstetrics & Gynecology

## 2016-06-26 ENCOUNTER — Other Ambulatory Visit: Payer: Self-pay | Admitting: Adult Health

## 2016-06-27 ENCOUNTER — Other Ambulatory Visit: Payer: Self-pay | Admitting: Obstetrics & Gynecology

## 2016-07-05 ENCOUNTER — Encounter: Payer: Self-pay | Admitting: Obstetrics & Gynecology

## 2016-07-05 ENCOUNTER — Ambulatory Visit (INDEPENDENT_AMBULATORY_CARE_PROVIDER_SITE_OTHER): Payer: 59 | Admitting: Obstetrics & Gynecology

## 2016-07-05 VITALS — BP 124/86 | HR 99 | Wt 244.0 lb

## 2016-07-05 DIAGNOSIS — N3010 Interstitial cystitis (chronic) without hematuria: Secondary | ICD-10-CM | POA: Diagnosis not present

## 2016-07-05 DIAGNOSIS — N3281 Overactive bladder: Secondary | ICD-10-CM | POA: Diagnosis not present

## 2016-07-05 NOTE — Progress Notes (Signed)
Diagnosed with IC: 2 years ago  Current Meds:  Elmiron, detrol la, DMSO Dietary restrictions    Pt states her symptoms have been stable, no exacerbations, although not perfect Wants to continue on this cycle  Blood pressure 124/86, pulse 99, weight 244 lb (110.7 kg), last menstrual period 05/28/2016.    The external urethra meatus was prepped with betadine DMSO 50 cc was instilled in the usual fashion after the bladder was catheterized and emptied completely 50cc was instilled into the bladder without difficulty and the patient tolerated well She will refrain from voiding as long as possible  Follow up in 8 weeks, or as patient requests based on her symptom complex

## 2016-07-11 ENCOUNTER — Other Ambulatory Visit: Payer: Self-pay | Admitting: Adult Health

## 2016-09-06 ENCOUNTER — Ambulatory Visit: Payer: 59 | Admitting: Obstetrics & Gynecology

## 2016-09-13 ENCOUNTER — Encounter: Payer: Self-pay | Admitting: Obstetrics & Gynecology

## 2016-09-13 ENCOUNTER — Encounter (INDEPENDENT_AMBULATORY_CARE_PROVIDER_SITE_OTHER): Payer: Self-pay

## 2016-09-13 ENCOUNTER — Ambulatory Visit (INDEPENDENT_AMBULATORY_CARE_PROVIDER_SITE_OTHER): Payer: 59 | Admitting: Obstetrics & Gynecology

## 2016-09-13 VITALS — BP 118/78 | HR 91 | Ht 65.0 in | Wt 247.0 lb

## 2016-09-13 DIAGNOSIS — N3010 Interstitial cystitis (chronic) without hematuria: Secondary | ICD-10-CM | POA: Diagnosis not present

## 2016-09-13 NOTE — Progress Notes (Signed)
Chief Complaint  Patient presents with  . bladder irrigation    and PAP    Blood pressure 118/78, pulse 91, height 5\' 5"  (1.651 m), weight 247 lb (112 kg), last menstrual period 08/21/2016.  Amanda Blackwell is in today for her bladder instillation for management of her interstitial cystitis Her symptoms of basically been stable no significant exacerbations although not perfect which is usual for her We are currently doing her installations every 8 weeks in overall this seems to work fine  She is also taking the [employer] Detrol long-acting as well as dietary restrictions  The urethral meatus was prepped with Betadine and 50 cc of DMSO was instilled in the usual fashion after the bladder was catheterized and completely emptied  She will refrain of course from voiding as long as possible to maximize the effect of the DMSO  We will see Amanda Blackwell back in 8 weeks or as the patient requests based on her symptom complex  Florian Buff, MD 09/13/2016 5:00 PM

## 2016-09-16 DIAGNOSIS — L718 Other rosacea: Secondary | ICD-10-CM | POA: Diagnosis not present

## 2016-10-12 ENCOUNTER — Other Ambulatory Visit: Payer: Self-pay | Admitting: Adult Health

## 2016-11-07 ENCOUNTER — Other Ambulatory Visit: Payer: Self-pay | Admitting: Adult Health

## 2016-11-08 ENCOUNTER — Encounter: Payer: Self-pay | Admitting: Obstetrics & Gynecology

## 2016-11-08 ENCOUNTER — Ambulatory Visit (INDEPENDENT_AMBULATORY_CARE_PROVIDER_SITE_OTHER): Payer: 59 | Admitting: Obstetrics & Gynecology

## 2016-11-08 VITALS — BP 110/80 | HR 95 | Ht 65.0 in | Wt 242.0 lb

## 2016-11-08 DIAGNOSIS — N3010 Interstitial cystitis (chronic) without hematuria: Secondary | ICD-10-CM

## 2016-11-08 NOTE — Progress Notes (Signed)
   Chief Complaint  Patient presents with  . Bladder Irrigation    Diagnosed with IC: 2016  Current Meds:  Elmiron, DMSO Dietary restrictions      Patient states her symptoms have basically been stable although she states I have my days No specific exacerbations She does suffer with allergies but mostly in the spring not as much in the fall  Blood pressure 110/80, pulse 95, height 5\' 5"  (1.651 m), weight 242 lb (109.8 kg).    She will refrain from voiding as long as possible He external urethral meatus is prepped with Betadine and DMSA 50 cc instilled in the bladder in the usual fashion after complete bladder emptying was performed The patient tolerated the procedure well without any problems and will hold the DMSO in her bladder as long as she possibly can for maximal effect Follow up in 8 weeks, or as patient requests based on her symptom complex

## 2017-01-06 ENCOUNTER — Ambulatory Visit: Payer: 59 | Admitting: Obstetrics & Gynecology

## 2017-01-10 ENCOUNTER — Encounter: Payer: Self-pay | Admitting: Obstetrics & Gynecology

## 2017-01-10 ENCOUNTER — Other Ambulatory Visit: Payer: Self-pay

## 2017-01-10 ENCOUNTER — Ambulatory Visit (INDEPENDENT_AMBULATORY_CARE_PROVIDER_SITE_OTHER): Payer: 59 | Admitting: Obstetrics & Gynecology

## 2017-01-10 VITALS — BP 110/68 | HR 95 | Ht 65.0 in | Wt 245.0 lb

## 2017-01-10 DIAGNOSIS — N3010 Interstitial cystitis (chronic) without hematuria: Secondary | ICD-10-CM | POA: Diagnosis not present

## 2017-01-10 MED ORDER — ALPRAZOLAM 0.5 MG PO TABS: ORAL_TABLET | ORAL | 5 refills | Status: DC

## 2017-01-10 NOTE — Progress Notes (Signed)
Diagnosed with IC: 2 years ago  Current Meds:  Elmiron, detrol LA 4 mg, DMSO irrigation Dietary restrictions    Pt states her symptoms have been stable, no exacerbations, although not perfect Wants to continue on this cycle  Blood pressure 110/68, pulse 95, height 5\' 5"  (1.651 m), weight 245 lb (111.1 kg).    The external urethra meatus was prepped with betadine DMSO 50 cc was instilled in the usual fashion after the bladder was catheterized and emptied completely 50cc was instilled into the bladder without difficulty and the patient tolerated well She will refrain from voiding as long as possible  Follow up in 8 weeks, or as patient requests based on her symptom complex

## 2017-01-22 ENCOUNTER — Other Ambulatory Visit: Payer: Self-pay | Admitting: Adult Health

## 2017-01-24 DIAGNOSIS — R3129 Other microscopic hematuria: Secondary | ICD-10-CM | POA: Diagnosis not present

## 2017-01-24 DIAGNOSIS — N342 Other urethritis: Secondary | ICD-10-CM | POA: Diagnosis not present

## 2017-01-24 DIAGNOSIS — R350 Frequency of micturition: Secondary | ICD-10-CM | POA: Diagnosis not present

## 2017-02-03 ENCOUNTER — Other Ambulatory Visit: Payer: Self-pay | Admitting: Adult Health

## 2017-02-16 ENCOUNTER — Other Ambulatory Visit: Payer: Self-pay | Admitting: Obstetrics & Gynecology

## 2017-02-16 DIAGNOSIS — Z1231 Encounter for screening mammogram for malignant neoplasm of breast: Secondary | ICD-10-CM

## 2017-02-20 ENCOUNTER — Other Ambulatory Visit: Payer: Self-pay | Admitting: Obstetrics & Gynecology

## 2017-03-04 DIAGNOSIS — J329 Chronic sinusitis, unspecified: Secondary | ICD-10-CM | POA: Diagnosis not present

## 2017-03-07 ENCOUNTER — Other Ambulatory Visit: Payer: Self-pay | Admitting: Adult Health

## 2017-03-08 ENCOUNTER — Ambulatory Visit: Payer: 59 | Admitting: Obstetrics & Gynecology

## 2017-03-11 ENCOUNTER — Ambulatory Visit
Admission: RE | Admit: 2017-03-11 | Discharge: 2017-03-11 | Disposition: A | Discharge status: Home / Self Care | Payer: 59 | Source: Ambulatory Visit | Attending: Obstetrics & Gynecology | Admitting: Obstetrics & Gynecology

## 2017-03-11 DIAGNOSIS — Z1231 Encounter for screening mammogram for malignant neoplasm of breast: Secondary | ICD-10-CM

## 2017-03-14 ENCOUNTER — Encounter: Payer: Self-pay | Admitting: Obstetrics & Gynecology

## 2017-03-14 ENCOUNTER — Ambulatory Visit (INDEPENDENT_AMBULATORY_CARE_PROVIDER_SITE_OTHER): Payer: 59 | Admitting: Obstetrics & Gynecology

## 2017-03-14 VITALS — BP 100/70 | HR 97 | Wt 242.0 lb

## 2017-03-14 DIAGNOSIS — N3010 Interstitial cystitis (chronic) without hematuria: Secondary | ICD-10-CM

## 2017-03-14 DIAGNOSIS — N3281 Overactive bladder: Secondary | ICD-10-CM

## 2017-03-14 NOTE — Progress Notes (Signed)
Diagnosed with IC: 02/2014  Current Meds:  Elmiron, DMSO Dietary restrictions    Pt states her symptoms have been stable, no exacerbations, although not perfect Wants to continue on this cycle  Blood pressure 100/70, pulse 97, weight 242 lb (109.8 kg), last menstrual period 03/05/2017.    The external urethra meatus was prepped with betadine DMSO 50 cc was instilled in the usual fashion after the bladder was catheterized and emptied completely 50cc was instilled into the bladder without difficulty and the patient tolerated well She will refrain from voiding as long as possible  Follow up in 8 weeks, or as patient requests based on her symptom complex

## 2017-04-04 DIAGNOSIS — J019 Acute sinusitis, unspecified: Secondary | ICD-10-CM | POA: Diagnosis not present

## 2017-04-04 DIAGNOSIS — R509 Fever, unspecified: Secondary | ICD-10-CM | POA: Diagnosis not present

## 2017-04-04 DIAGNOSIS — R0602 Shortness of breath: Secondary | ICD-10-CM | POA: Diagnosis not present

## 2017-04-26 ENCOUNTER — Other Ambulatory Visit: Payer: Self-pay | Admitting: Adult Health

## 2017-05-17 ENCOUNTER — Other Ambulatory Visit: Payer: Self-pay

## 2017-05-17 ENCOUNTER — Encounter: Payer: Self-pay | Admitting: Obstetrics & Gynecology

## 2017-05-17 ENCOUNTER — Ambulatory Visit (INDEPENDENT_AMBULATORY_CARE_PROVIDER_SITE_OTHER): Payer: 59 | Admitting: Obstetrics & Gynecology

## 2017-05-17 VITALS — BP 126/84 | HR 99 | Ht 65.0 in | Wt 242.0 lb

## 2017-05-17 DIAGNOSIS — N3010 Interstitial cystitis (chronic) without hematuria: Secondary | ICD-10-CM

## 2017-05-17 NOTE — Progress Notes (Signed)
Diagnosed with IC: 2015  Current Meds:  Elmiron, DMSO, Detrol LA Dietary restrictions    Pt states her symptoms have been stable, no exacerbations, although not perfect Wants to continue on this cycle  Blood pressure 126/84, pulse 99, height 5\' 5"  (1.651 m), weight 242 lb (109.8 kg).    The external urethra meatus was prepped with betadine DMSO 50 cc was instilled in the usual fashion after the bladder was catheterized and emptied completely 50cc was instilled into the bladder without difficulty and the patient tolerated well She will refrain from voiding as long as possible  Follow up in 8 weeks, or as patient requests based on her symptom complex

## 2017-07-12 ENCOUNTER — Ambulatory Visit (INDEPENDENT_AMBULATORY_CARE_PROVIDER_SITE_OTHER): Payer: 59 | Admitting: Obstetrics & Gynecology

## 2017-07-12 ENCOUNTER — Encounter: Payer: Self-pay | Admitting: Obstetrics & Gynecology

## 2017-07-12 ENCOUNTER — Other Ambulatory Visit: Payer: Self-pay

## 2017-07-12 VITALS — BP 127/86 | HR 102 | Ht 65.0 in | Wt 247.0 lb

## 2017-07-12 DIAGNOSIS — N3281 Overactive bladder: Secondary | ICD-10-CM

## 2017-07-12 DIAGNOSIS — N3010 Interstitial cystitis (chronic) without hematuria: Secondary | ICD-10-CM | POA: Diagnosis not present

## 2017-07-12 NOTE — Progress Notes (Signed)
Diagnosed with IC: 2015  Current Meds:  DMSO Elmiron Detrol LA Dietary restrictions    Pt states her symptoms have been stable, no exacerbations, although not perfect Wants to continue on this cycle  Height 5\' 5"  (1.651 m), weight 247 lb (112 kg).    The external urethra meatus was prepped with betadine DMSO 50 cc was instilled in the usual fashion after the bladder was catheterized and emptied completely 50cc was instilled into the bladder without difficulty and the patient tolerated well She will refrain from voiding as long as possible  Follow up in 8 weeks, or as patient requests based on her symptom complex

## 2017-07-13 ENCOUNTER — Other Ambulatory Visit: Payer: Self-pay | Admitting: *Deleted

## 2017-07-13 ENCOUNTER — Encounter (INDEPENDENT_AMBULATORY_CARE_PROVIDER_SITE_OTHER): Payer: Self-pay | Admitting: *Deleted

## 2017-07-13 DIAGNOSIS — Z1211 Encounter for screening for malignant neoplasm of colon: Secondary | ICD-10-CM

## 2017-07-27 DIAGNOSIS — Z6841 Body Mass Index (BMI) 40.0 and over, adult: Secondary | ICD-10-CM | POA: Diagnosis not present

## 2017-07-27 DIAGNOSIS — R1012 Left upper quadrant pain: Secondary | ICD-10-CM | POA: Diagnosis not present

## 2017-08-07 ENCOUNTER — Other Ambulatory Visit: Payer: Self-pay | Admitting: Obstetrics & Gynecology

## 2017-08-09 ENCOUNTER — Encounter (INDEPENDENT_AMBULATORY_CARE_PROVIDER_SITE_OTHER): Payer: Self-pay | Admitting: *Deleted

## 2017-08-09 ENCOUNTER — Telehealth (INDEPENDENT_AMBULATORY_CARE_PROVIDER_SITE_OTHER): Payer: Self-pay | Admitting: *Deleted

## 2017-08-09 ENCOUNTER — Other Ambulatory Visit (INDEPENDENT_AMBULATORY_CARE_PROVIDER_SITE_OTHER): Payer: Self-pay | Admitting: *Deleted

## 2017-08-09 DIAGNOSIS — Z1211 Encounter for screening for malignant neoplasm of colon: Secondary | ICD-10-CM | POA: Insufficient documentation

## 2017-08-09 DIAGNOSIS — Z800 Family history of malignant neoplasm of digestive organs: Secondary | ICD-10-CM

## 2017-08-09 MED ORDER — PEG 3350-KCL-NA BICARB-NACL 420 G PO SOLR: 4000.0000 mL | Freq: Once | ORAL | 0 refills | Status: AC

## 2017-08-09 NOTE — Telephone Encounter (Signed)
Agree 

## 2017-08-09 NOTE — Telephone Encounter (Signed)
Patient needs trilyte 

## 2017-08-09 NOTE — Telephone Encounter (Signed)
Referring MD/PCP: golding   Procedure: tcs with propofol  Reason/Indication:  Screening, fam hx colon ca  Has patient had this procedure before?  no  If so, when, by whom and where?    Is there a family history of colon cancer?  Yes, mother  Who?  What age when diagnosed?    Is patient diabetic?   no      Does patient have prosthetic heart valve or mechanical valve?  no  Do you have a pacemaker?  no  Has patient ever had endocarditis? no  Has patient had joint replacement within last 12 months?  no  Is patient constipated or do they take laxatives? no  Does patient have a history of alcohol/drug use?  no  Is patient on blood thinner such as Coumadin, Plavix and/or Aspirin? no  Medications: wellbutrin 150 mg tid, alprazolam 0.5 mg 1.2 tab prn, elmiron 100 mg 2 tab bid, tolterodine 4 mg daily, bc powder daily, vit c daily, albuterol inhaler prn  Allergies: latex  Medication Adjustment per Dr Lindi Adie, NP: [facility] powder 2 days  Procedure date & time: 09/09/17 at 830 (700) preop 8/7 @ 1245

## 2017-08-17 ENCOUNTER — Other Ambulatory Visit: Payer: Self-pay | Admitting: Adult Health

## 2017-08-31 ENCOUNTER — Other Ambulatory Visit (HOSPITAL_COMMUNITY): Payer: 59

## 2017-09-06 ENCOUNTER — Ambulatory Visit: Payer: 59 | Admitting: Obstetrics & Gynecology

## 2017-09-09 ENCOUNTER — Ambulatory Visit (HOSPITAL_COMMUNITY): Admit: 2017-09-09 | Payer: 59 | Admitting: Internal Medicine

## 2017-09-09 ENCOUNTER — Encounter (HOSPITAL_COMMUNITY): Payer: Self-pay

## 2017-09-09 SURGERY — COLONOSCOPY WITH PROPOFOL
Anesthesia: Monitor Anesthesia Care

## 2017-09-13 ENCOUNTER — Encounter: Payer: Self-pay | Admitting: Obstetrics & Gynecology

## 2017-09-13 ENCOUNTER — Other Ambulatory Visit: Payer: Self-pay

## 2017-09-13 ENCOUNTER — Ambulatory Visit (INDEPENDENT_AMBULATORY_CARE_PROVIDER_SITE_OTHER): Payer: 59 | Admitting: Obstetrics & Gynecology

## 2017-09-13 VITALS — BP 122/72 | HR 88 | Ht 65.0 in | Wt 249.0 lb

## 2017-09-13 DIAGNOSIS — N3010 Interstitial cystitis (chronic) without hematuria: Secondary | ICD-10-CM

## 2017-09-13 DIAGNOSIS — N3281 Overactive bladder: Secondary | ICD-10-CM

## 2017-09-13 NOTE — Progress Notes (Signed)
Diagnosed with IC: 3 years ago  Current Meds:  Elmiron, DMSO Dietary restrictions    Pt states her symptoms have been stable, no exacerbations, although not perfect Wants to continue on this cycle  Blood pressure 122/72, pulse 88, height 5\' 5"  (1.651 m), weight 249 lb (112.9 kg).    The external urethra meatus was prepped with betadine DMSO 50 cc was instilled in the usual fashion after the bladder was catheterized and emptied completely 50cc was instilled into the bladder without difficulty and the patient tolerated well She will refrain from voiding as long as possible  Follow up in 8 weeks, or as patient requests based on her symptom complex

## 2017-10-11 ENCOUNTER — Other Ambulatory Visit: Payer: Self-pay | Admitting: Adult Health

## 2017-10-14 ENCOUNTER — Telehealth: Payer: Self-pay | Admitting: Adult Health

## 2017-10-14 MED ORDER — BUPROPION HCL ER (XL) 150 MG PO TB24: ORAL_TABLET | ORAL | 6 refills | Status: DC

## 2017-10-14 NOTE — Telephone Encounter (Signed)
Called refills in for Wellbutrin

## 2017-10-14 NOTE — Telephone Encounter (Signed)
Requesting refill on Wellbutrin. Please advise.

## 2017-11-14 ENCOUNTER — Ambulatory Visit (INDEPENDENT_AMBULATORY_CARE_PROVIDER_SITE_OTHER): Payer: 59 | Admitting: Obstetrics & Gynecology

## 2017-11-14 ENCOUNTER — Encounter: Payer: Self-pay | Admitting: Obstetrics & Gynecology

## 2017-11-14 VITALS — BP 146/82 | HR 102 | Ht 65.0 in | Wt 245.0 lb

## 2017-11-14 DIAGNOSIS — N3010 Interstitial cystitis (chronic) without hematuria: Secondary | ICD-10-CM

## 2017-11-14 NOTE — Progress Notes (Signed)
Diagnosed with IC: several years  Current Meds:  Elmiron, DMSO, Detrol LA Dietary restrictions    Pt states her symptoms have been stable, no exacerbations, although not perfect Wants to continue on this cycle  Blood pressure (!) 146/82, pulse (!) 102, height 5\' 5"  (1.651 m), weight 245 lb (111.1 kg).    The external urethra meatus was prepped with betadine DMSO 50 cc was instilled in the usual fashion after the bladder was catheterized and emptied completely 50cc was instilled into the bladder without difficulty and the patient tolerated well She will refrain from voiding as long as possible  Follow up in 8 weeks, or as patient requests based on her symptom complex

## 2017-12-05 DIAGNOSIS — Z1389 Encounter for screening for other disorder: Secondary | ICD-10-CM | POA: Diagnosis not present

## 2017-12-05 DIAGNOSIS — Z6839 Body mass index (BMI) 39.0-39.9, adult: Secondary | ICD-10-CM | POA: Diagnosis not present

## 2017-12-05 DIAGNOSIS — Z0000 Encounter for general adult medical examination without abnormal findings: Secondary | ICD-10-CM | POA: Diagnosis not present

## 2018-01-09 ENCOUNTER — Encounter: Payer: Self-pay | Admitting: Obstetrics & Gynecology

## 2018-01-09 ENCOUNTER — Ambulatory Visit (INDEPENDENT_AMBULATORY_CARE_PROVIDER_SITE_OTHER): Payer: 59 | Admitting: Obstetrics & Gynecology

## 2018-01-09 ENCOUNTER — Encounter (INDEPENDENT_AMBULATORY_CARE_PROVIDER_SITE_OTHER): Payer: Self-pay

## 2018-01-09 VITALS — BP 142/85 | HR 96 | Ht 65.0 in | Wt 234.5 lb

## 2018-01-09 DIAGNOSIS — N3010 Interstitial cystitis (chronic) without hematuria: Secondary | ICD-10-CM | POA: Diagnosis not present

## 2018-01-09 NOTE — Progress Notes (Signed)
Diagnosed with IC:  01/2015  Current Meds:  Elmiron Dietary restrictions    Pt states her symptoms have been stable, no exacerbations, although not perfect Wants to continue on this cycle  Blood pressure (!) 142/85, pulse 96, height 5\' 5"  (1.651 m), weight 234 lb 8 oz (106.4 kg).    The external urethra meatus was prepped with betadine DMSO 50 cc was instilled in the usual fashion after the bladder was catheterized and emptied completely 50cc was instilled into the bladder without difficulty and the patient tolerated well She will refrain from voiding as long as possible  Follow up in 8 weeks, or as patient requests based on her symptom complex

## 2018-01-22 ENCOUNTER — Other Ambulatory Visit: Payer: Self-pay | Admitting: Obstetrics & Gynecology

## 2018-01-27 ENCOUNTER — Telehealth: Payer: Self-pay | Admitting: *Deleted

## 2018-01-27 NOTE — Telephone Encounter (Signed)
Patient canceled TCS appt per [employer].  01-27-2018  AS

## 2018-03-06 ENCOUNTER — Other Ambulatory Visit: Payer: Self-pay | Admitting: Obstetrics & Gynecology

## 2018-03-06 ENCOUNTER — Ambulatory Visit: Payer: 59 | Admitting: Obstetrics & Gynecology

## 2018-03-22 ENCOUNTER — Other Ambulatory Visit: Payer: Self-pay | Admitting: Adult Health

## 2018-03-30 ENCOUNTER — Ambulatory Visit (INDEPENDENT_AMBULATORY_CARE_PROVIDER_SITE_OTHER): Payer: BLUE CROSS/BLUE SHIELD | Admitting: Obstetrics & Gynecology

## 2018-03-30 ENCOUNTER — Other Ambulatory Visit: Payer: Self-pay

## 2018-03-30 ENCOUNTER — Encounter: Payer: Self-pay | Admitting: Obstetrics & Gynecology

## 2018-03-30 VITALS — BP 123/85 | HR 102 | Ht 65.0 in | Wt 239.0 lb

## 2018-03-30 DIAGNOSIS — N3010 Interstitial cystitis (chronic) without hematuria: Secondary | ICD-10-CM | POA: Diagnosis not present

## 2018-03-30 MED ORDER — BUPROPION HCL ER (XL) 150 MG PO TB24: ORAL_TABLET | ORAL | 3 refills | Status: DC

## 2018-03-30 NOTE — Addendum Note (Signed)
Addended by: Florian Buff on: 03/30/2018 12:14 PM   Modules accepted: Orders

## 2018-03-30 NOTE — Progress Notes (Signed)
Diagnosed with IC: 01/2015  Current Meds:  Elmiron + DMSO+ Dietary restrictions    Pt states her symptoms have been stable, no exacerbations, although not perfect Wants to continue on this cycle  There were no vitals taken for this visit.    The external urethra meatus was prepped with betadine DMSO 50 cc was instilled in the usual fashion after the bladder was catheterized and emptied completely 50cc was instilled into the bladder without difficulty and the patient tolerated well She will refrain from voiding as long as possible  Follow up in 8 weeks, or as patient requests based on her symptom complex

## 2018-04-06 ENCOUNTER — Telehealth: Payer: Self-pay | Admitting: Obstetrics & Gynecology

## 2018-04-06 ENCOUNTER — Ambulatory Visit: Payer: Self-pay | Admitting: Obstetrics & Gynecology

## 2018-04-06 NOTE — Telephone Encounter (Signed)
Patient's spouse presented to the office and stated that the mail order needs a quality care dosing override for Wellbutrin.  He stated that the patient takes three pills a day and they only allow one pill a day, a month supply.   Mail order [phone]  [phone]

## 2018-04-07 NOTE — Telephone Encounter (Signed)
Left message to call me.

## 2018-04-13 ENCOUNTER — Emergency Department (HOSPITAL_COMMUNITY)
Admission: EM | Admit: 2018-04-13 | Discharge: 2018-04-13 | Disposition: A | Discharge status: Home / Self Care | Payer: BLUE CROSS/BLUE SHIELD | Attending: Emergency Medicine | Admitting: Emergency Medicine

## 2018-04-13 ENCOUNTER — Other Ambulatory Visit: Payer: Self-pay

## 2018-04-13 ENCOUNTER — Encounter (HOSPITAL_COMMUNITY): Payer: Self-pay

## 2018-04-13 ENCOUNTER — Emergency Department (HOSPITAL_COMMUNITY): Payer: BLUE CROSS/BLUE SHIELD

## 2018-04-13 DIAGNOSIS — Z79899 Other long term (current) drug therapy: Secondary | ICD-10-CM | POA: Diagnosis not present

## 2018-04-13 DIAGNOSIS — R1031 Right lower quadrant pain: Secondary | ICD-10-CM | POA: Diagnosis not present

## 2018-04-13 DIAGNOSIS — F17290 Nicotine dependence, other tobacco product, uncomplicated: Secondary | ICD-10-CM | POA: Insufficient documentation

## 2018-04-13 DIAGNOSIS — R52 Pain, unspecified: Secondary | ICD-10-CM

## 2018-04-13 LAB — URINALYSIS, ROUTINE W REFLEX MICROSCOPIC
Bilirubin Urine: NEGATIVE
Glucose, UA: NEGATIVE mg/dL
Hgb urine dipstick: NEGATIVE
KETONES UR: NEGATIVE mg/dL
Nitrite: NEGATIVE
Protein, ur: NEGATIVE mg/dL
Specific Gravity, Urine: 1.004 — ABNORMAL LOW (ref 1.005–1.030)
pH: 6 (ref 5.0–8.0)

## 2018-04-13 LAB — WET PREP, GENITAL
Clue Cells Wet Prep HPF POC: NONE SEEN
Sperm: NONE SEEN
Trich, Wet Prep: NONE SEEN
YEAST WET PREP: NONE SEEN

## 2018-04-13 LAB — CBC WITH DIFFERENTIAL/PLATELET
Abs Immature Granulocytes: 0.03 10*3/uL (ref 0.00–0.07)
BASOS PCT: 1 %
Basophils Absolute: 0.1 10*3/uL (ref 0.0–0.1)
Eosinophils Absolute: 0.3 10*3/uL (ref 0.0–0.5)
Eosinophils Relative: 3 %
HCT: 41 % (ref 36.0–46.0)
Hemoglobin: 13.6 g/dL (ref 12.0–15.0)
Immature Granulocytes: 0 %
LYMPHS ABS: 2.7 10*3/uL (ref 0.7–4.0)
Lymphocytes Relative: 31 %
MCH: 29.3 pg (ref 26.0–34.0)
MCHC: 33.2 g/dL (ref 30.0–36.0)
MCV: 88.4 fL (ref 80.0–100.0)
Monocytes Absolute: 0.6 10*3/uL (ref 0.1–1.0)
Monocytes Relative: 7 %
Neutro Abs: 5.1 10*3/uL (ref 1.7–7.7)
Neutrophils Relative %: 58 %
PLATELETS: 307 10*3/uL (ref 150–400)
RBC: 4.64 MIL/uL (ref 3.87–5.11)
RDW: 12.4 % (ref 11.5–15.5)
WBC: 8.7 10*3/uL (ref 4.0–10.5)
nRBC: 0 % (ref 0.0–0.2)

## 2018-04-13 LAB — COMPREHENSIVE METABOLIC PANEL
ALT: 21 U/L (ref 0–44)
AST: 19 U/L (ref 15–41)
Albumin: 4.3 g/dL (ref 3.5–5.0)
Alkaline Phosphatase: 50 U/L (ref 38–126)
Anion gap: 10 (ref 5–15)
BUN: 12 mg/dL (ref 6–20)
CO2: 22 mmol/L (ref 22–32)
Calcium: 9.7 mg/dL (ref 8.9–10.3)
Chloride: 105 mmol/L (ref 98–111)
Creatinine, Ser: 0.68 mg/dL (ref 0.44–1.00)
GFR calc Af Amer: >60 mL/min (ref >60)
Glucose, Bld: 89 mg/dL (ref 70–99)
Potassium: 4 mmol/L (ref 3.5–5.1)
Sodium: 137 mmol/L (ref 135–145)
Total Bilirubin: 0.6 mg/dL (ref 0.3–1.2)
Total Protein: 7.5 g/dL (ref 6.5–8.1)

## 2018-04-13 LAB — LIPASE, BLOOD: Lipase: 26 U/L (ref 11–51)

## 2018-04-13 LAB — PREGNANCY, URINE: Preg Test, Ur: NEGATIVE

## 2018-04-13 MED ORDER — SODIUM CHLORIDE 0.9 % IV BOLUS
1000.0000 mL | Freq: Once | INTRAVENOUS | Status: AC
  Administered 2018-04-13: 1000 mL via INTRAVENOUS

## 2018-04-13 MED ORDER — HYDROMORPHONE HCL 1 MG/ML IJ SOLN
0.5000 mg | Freq: Once | INTRAMUSCULAR | Status: AC
  Administered 2018-04-13: 0.5 mg via INTRAVENOUS
  Filled 2018-04-13: qty 1

## 2018-04-13 MED ORDER — ONDANSETRON HCL 4 MG/2ML IJ SOLN
4.0000 mg | Freq: Once | INTRAMUSCULAR | Status: AC
  Administered 2018-04-13: 4 mg via INTRAVENOUS
  Filled 2018-04-13: qty 2

## 2018-04-13 MED ORDER — IOHEXOL 300 MG/ML  SOLN
100.0000 mL | Freq: Once | INTRAMUSCULAR | Status: AC | PRN
  Administered 2018-04-13: 100 mL via INTRAVENOUS

## 2018-04-13 MED ORDER — HYDROCODONE-ACETAMINOPHEN 5-325 MG PO TABS: 1.0000 | ORAL_TABLET | ORAL | 0 refills | Status: DC | PRN

## 2018-04-13 MED ORDER — IBUPROFEN 600 MG PO TABS: 600.0000 mg | ORAL_TABLET | Freq: Four times a day (QID) | ORAL | 0 refills | Status: DC | PRN

## 2018-04-13 NOTE — ED Notes (Signed)
ED Provider at bedside. 

## 2018-04-13 NOTE — ED Provider Notes (Signed)
[facility] EMERGENCY DEPARTMENT Provider Note   CSN: [id number] Arrival date & time: 04/13/18  2440    History   Chief Complaint Chief Complaint  Patient presents with   Abdominal Pain    HPI Amanda Blackwell is a 43 y.o. female.     The history is provided by the patient. No language interpreter was used.  Abdominal Pain  Pain location:  RLQ Pain quality: aching   Pain radiates to:  Does not radiate Pain severity:  No pain Onset quality:  Gradual Timing:  Constant Progression:  Worsening Chronicity:  New Context: not sick contacts and not trauma   Relieved by:  Nothing Worsened by:  Nothing Ineffective treatments:  None tried Associated symptoms: nausea   Associated symptoms: no vomiting   Risk factors: has not had multiple surgeries   Pt reports she began having severe pain in her right lower abdomen.  Pt states no history of std risk. No uti symptoms,   Past Medical History:  Diagnosis Date   Anxiety    Contraceptive management 09/11/2013   Depression    Deviated septum 06/2012   Family history of colon cancer in mother 01/03/2015   Frequent urination    GERD (gastroesophageal reflux disease)    Headache(784.0)    sinus   IC (interstitial cystitis) 01/31/2015   Nasal turbinate hypertrophy 06/2012   bilateral   OAB (overactive bladder) 02/16/2013   Trial myrbetriq   PONV (postoperative nausea and vomiting)    Seasonal allergies    Stomach pain 01/03/2015   Unspecified symptom associated with female genital organs 02/14/2013   Vaginal discharge 02/14/2013    Patient Active Problem List   Diagnosis Date Noted   Special screening for malignant neoplasms, colon 08/09/2017   Family hx of colon cancer 08/09/2017   IC (interstitial cystitis) 01/31/2015   Stomach pain 01/03/2015   Family history of colon cancer in mother 01/03/2015   Anxiety 09/11/2013   Contraceptive management 09/11/2013   OAB (overactive bladder) 02/16/2013    Unspecified symptom associated with female genital organs 02/14/2013   Vaginal discharge 02/14/2013    Past Surgical History:  Procedure Laterality Date   CESAREAN SECTION  10/20/2006   CHOLECYSTECTOMY  07/14/2005   laparoscopic   CYST EXCISION Left 08/14/2001   postauricular   dilation and currettage     NASAL SEPTOPLASTY W/ TURBINOPLASTY Bilateral 07/24/2012   Procedure: NASAL SEPTOPLASTY WITH BILATERAL TURBINATE RESECTION;  Surgeon: Ascencion Dike, MD;  Location: [facility];  Service: ENT;  Laterality: Bilateral;     OB History    Gravida  2   Para  1   Term  1   Preterm      AB  1   Living  1     SAB  1   TAB      Ectopic      Multiple      Live Births  1            Home Medications    Prior to Admission medications   Medication Sig Start Date End Date Taking? Authorizing Provider  ALPRAZolam Duanne Moron) 0.5 MG tablet TAKE 1 TABLET BY MOUTH EVERY NIGHT AT BEDTIME AS NEEDED FOR ANXIETY 01/23/18  Yes Florian Buff, MD  Ascorbic Acid (VITAMIN C) 1000 MG tablet Take 1,000 mg by mouth daily.   Yes [provider]  buPROPion (WELLBUTRIN XL) 150 MG 24 hr tablet TAKE 3 TABLETS(450 MG) BY MOUTH EVERY MORNING 03/30/18  Yes  Florian Buff, MD  cetirizine (ZYRTEC) 10 MG tablet Take 10 mg by mouth daily.   Yes [provider]  ELMIRON 100 MG capsule TAKE 2 CAPSULES BY MOUTH TWICE DAILY 03/07/18  Yes Florian Buff, MD  tolterodine (DETROL LA) 4 MG 24 hr capsule TAKE 1 CAPSULE BY MOUTH DAILY 03/22/18  Yes Derrek Monaco A, NP  VELIVET 0.1/0.125/0.15 -0.025 MG tablet TAKE 1 TABLET BY MOUTH EVERY DAY 10/11/17  Yes Derrek Monaco A, NP  VITAMIN D PO Take 50,000 Units by mouth once a week.   Yes [provider]  PROAIR HFA 108 [phone] Base) MCG/ACT inhaler as needed.  04/04/17   [provider]    Family History Family History  Problem Relation Age of Onset   Anesthesia problems Maternal Grandmother        hard to wake up  post-op   Cancer Maternal Grandmother        cervical   Congestive Heart Failure Maternal Grandmother    Cancer Mother        colon   Diabetes Mother    Parkinson's disease Father    ADD / ADHD Daughter    Diabetes Maternal Aunt    Cancer Paternal Grandmother        breast   Breast cancer Paternal Grandmother        ? age of onset   Cancer Paternal Grandfather        lung    Social History Social History   Tobacco Use   Smoking status: Current Every Day Smoker    Types: E-cigarettes    Last attempt to quit: 11/26/2010    Years since quitting: 7.3   Smokeless tobacco: Never Used  Substance Use Topics   Alcohol use: Yes    Comment: occ   Drug use: No     Allergies   Latex   Review of Systems Review of Systems  Gastrointestinal: Positive for abdominal pain and nausea. Negative for vomiting.  All other systems reviewed and are negative.    Physical Exam Updated Vital Signs BP 124/64    Pulse 92    Temp 98.4 F (36.9 C) (Oral)    Resp 14    Ht 5\' 5"  (1.651 m)    Wt 106.1 kg    LMP 03/30/2018 Comment: neg   SpO2 99%    BMI 38.94 kg/m   Physical Exam Vitals signs reviewed.  HENT:     Head: Normocephalic.  Cardiovascular:     Rate and Rhythm: Normal rate.     Heart sounds: Normal heart sounds.  Pulmonary:     Effort: Pulmonary effort is normal.  Abdominal:     General: Abdomen is flat. Bowel sounds are normal.     Palpations: Abdomen is soft.     Tenderness: There is generalized abdominal tenderness.  Genitourinary:    Vagina: Normal.     Cervix: Friability present.     Uterus: Normal.      Adnexa: Right adnexa normal and left adnexa normal.  Skin:    General: Skin is warm.  Neurological:     General: No focal deficit present.     Mental Status: She is alert.  Psychiatric:        Mood and Affect: Mood normal.      ED Treatments / Results  Labs (all labs ordered are listed, but only abnormal results are displayed) Labs Reviewed    URINALYSIS, ROUTINE W REFLEX MICROSCOPIC - Abnormal; Notable for the  following components:      Result Value   APPearance HAZY (*)    Specific Gravity, Urine 1.004 (*)    Leukocytes,Ua TRACE (*)    Bacteria, UA RARE (*)    All other components within normal limits  WET PREP, GENITAL  CBC WITH DIFFERENTIAL/PLATELET  COMPREHENSIVE METABOLIC PANEL  PREGNANCY, URINE  LIPASE, BLOOD  GC/CHLAMYDIA PROBE AMP ([place]) NOT AT [facility]    EKG None  Radiology Ct Abdomen Pelvis W Contrast  Result Date: 04/13/2018 CLINICAL DATA:  Acute onset right lower quadrant pain 1 hour ago. EXAM: CT ABDOMEN AND PELVIS WITH CONTRAST TECHNIQUE: Multidetector CT imaging of the abdomen and pelvis was performed using the standard protocol following bolus administration of intravenous contrast. CONTRAST:  100 mL OMNIPAQUE IOHEXOL 300 MG/ML  SOLN COMPARISON:  CT abdomen and pelvis 05/22/2005. FINDINGS: Lower chest: Mild dependent atelectasis noted. No pleural or pericardial effusion. Hepatobiliary: No focal liver abnormality is seen. Status post cholecystectomy. No biliary dilatation. Pancreas: Unremarkable. No pancreatic ductal dilatation or surrounding inflammatory changes. Spleen: Normal in size without focal abnormality. Adrenals/Urinary Tract: Adrenal glands are unremarkable. Kidneys are normal, without renal calculi, focal lesion, or hydronephrosis. Bladder is unremarkable. Stomach/Bowel: Stomach is within normal limits. Appendix appears normal. No evidence of bowel wall thickening, distention, or inflammatory changes. Vascular/Lymphatic: No significant vascular findings are present. No enlarged abdominal or pelvic lymph nodes. Reproductive: Uterus and bilateral adnexa are unremarkable. Other: Small fat containing umbilical hernia noted. Musculoskeletal: Negative. IMPRESSION: Negative for appendicitis. No acute abnormality or finding to explain the patient's symptoms. Small fat containing umbilical hernia. Status post  cholecystectomy. Electronically Signed   By: Inge Rise M.D.   On: 04/13/2018 12:35   US Pelvic Complete W Transvaginal And Torsion R/o  Result Date: 04/13/2018 CLINICAL DATA:  43 year old female with right lower quadrant pain and nausea. Unrevealing CT Abdomen and Pelvis today. EXAM: TRANSABDOMINAL AND TRANSVAGINAL ULTRASOUND OF PELVIS DOPPLER ULTRASOUND OF OVARIES TECHNIQUE: Both transabdominal and transvaginal ultrasound examinations of the pelvis were performed. Transabdominal technique was performed for global imaging of the pelvis including uterus, ovaries, adnexal regions, and pelvic cul-de-sac. It was necessary to proceed with endovaginal exam following the transabdominal exam to visualize the ovaries. Color and duplex Doppler ultrasound was utilized to evaluate blood flow to the ovaries. COMPARISON:  CT Abdomen and Pelvis earlier today FINDINGS: Uterus Measurements: 7.1 x 3.0 x 3.9 centimeters = volume: 42 mL. In the uterine body and fundus no fibroids or other mass visualized. The lower uterine segment and cervix appears mildly heterogeneous with small echogenic foci (images 39 and 48) in an area of 12 millimeters which was not apparent by CT. Endometrium Thickness: 3 millimeters.  No focal abnormality visualized. Right ovary Measurements: 2.3 x 1.7 x 1.6 centimeters = volume: 3 mL. Normal appearance/no adnexal mass. Subcentimeter physiologic cyst. Left ovary Measurements: Could not be identified. The left ovary appear normal by CT today lateral to the sigmoid colon on series 2, image 80 of that exam. Pulsed Doppler evaluation of the right ovary demonstrates normal low-resistance arterial and venous waveforms. Other findings No free fluid. IMPRESSION: 1. Normal right ovary with no evidence of torsion. 2. The left ovary could not be identified by ultrasound, but appeared normal by CT earlier today. 3. Indeterminate 12 mm area of heterogeneous echoes in the cervix/lower uterine segment. This is not  correlated on the CT earlier today. Recommend correlation with speculum exam. Electronically Signed   By: Genevie Ann M.D.   On: 04/13/2018  14:05    Procedures Procedures (including critical care time)  Medications Ordered in ED Medications  sodium chloride 0.9 % bolus 1,000 mL (0 mLs Intravenous Stopped 04/13/18 1208)  HYDROmorphone (DILAUDID) injection 0.5 mg (0.5 mg Intravenous Given 04/13/18 1123)  ondansetron (ZOFRAN) injection 4 mg (4 mg Intravenous Given 04/13/18 1123)  iohexol (OMNIPAQUE) 300 MG/ML solution 100 mL (100 mLs Intravenous Contrast Given 04/13/18 1217)     Initial Impression / Assessment and Plan / ED Course  I have reviewed the triage vital signs and the nursing notes.  Pertinent labs & imaging results that were available during my care of the patient were reviewed by me and considered in my medical decision making (see chart for details).        MDM  Pt advised to follow up with Dr. Elonda Husky.  Pt given Rx for Hydrocodone and ibuprofen   Final Clinical Impressions(s) / ED Diagnoses   Final diagnoses:  Pain  RLQ abdominal pain    ED Discharge Orders         Ordered    HYDROcodone-acetaminophen (NORCO/VICODIN) 5-325 MG tablet  Every 4 hours PRN     04/13/18 1620    ibuprofen (ADVIL,MOTRIN) 600 MG tablet  Every 6 hours PRN     04/13/18 1620        An After Visit Summary was printed and given to the patient.    Fransico Meadow, [place] 04/13/18 1621    Milton Ferguson, MD 04/14/18 0830

## 2018-04-13 NOTE — Discharge Instructions (Addendum)
Return if any problems.

## 2018-04-13 NOTE — ED Triage Notes (Signed)
Pt reports sudden onset of rlq pain x 1 hour.  Denies vomiting or diarrhea but reports nauseated and feeling "clammy."  LBM was yesterday.  Denies urinary symptoms.

## 2018-04-13 NOTE — ED Notes (Signed)
Patient transported to CT 

## 2018-04-13 NOTE — ED Notes (Signed)
Patient still unable to provide urine specimen 

## 2018-04-15 LAB — GC/CHLAMYDIA PROBE AMP (~~LOC~~) NOT AT ARMC
Chlamydia: NEGATIVE
Neisseria Gonorrhea: NEGATIVE

## 2018-04-19 ENCOUNTER — Telehealth: Payer: Self-pay | Admitting: *Deleted

## 2018-04-19 ENCOUNTER — Encounter: Payer: Self-pay | Admitting: Adult Health

## 2018-04-19 ENCOUNTER — Ambulatory Visit: Payer: BLUE CROSS/BLUE SHIELD | Admitting: Adult Health

## 2018-04-19 ENCOUNTER — Other Ambulatory Visit: Payer: Self-pay

## 2018-04-19 ENCOUNTER — Other Ambulatory Visit (HOSPITAL_COMMUNITY)
Admission: RE | Admit: 2018-04-19 | Discharge: 2018-04-19 | Disposition: A | Discharge status: Home / Self Care | Payer: BLUE CROSS/BLUE SHIELD | Source: Ambulatory Visit | Attending: Adult Health | Admitting: Adult Health

## 2018-04-19 VITALS — BP 134/81 | HR 96 | Temp 98.6°F | Ht 65.0 in | Wt 239.5 lb

## 2018-04-19 DIAGNOSIS — R1031 Right lower quadrant pain: Secondary | ICD-10-CM

## 2018-04-19 DIAGNOSIS — Z124 Encounter for screening for malignant neoplasm of cervix: Secondary | ICD-10-CM | POA: Insufficient documentation

## 2018-04-19 DIAGNOSIS — N888 Other specified noninflammatory disorders of cervix uteri: Secondary | ICD-10-CM

## 2018-04-19 MED ORDER — BUPROPION HCL ER (XL) 150 MG PO TB24: ORAL_TABLET | ORAL | 6 refills | Status: DC

## 2018-04-19 NOTE — Addendum Note (Signed)
Addended by: Linton Rump on: 04/19/2018 01:38 PM   Modules accepted: Orders

## 2018-04-19 NOTE — Telephone Encounter (Signed)
Spoke with pt letting her know no visitors or children at today's appt. Also, pt states she hasn't come in contact with anyone with Covid-19 nor is she experiencing any symptoms herself. [place]

## 2018-04-19 NOTE — Progress Notes (Signed)
Patient ID: Amanda Blackwell, female   DOB: March 12, 1975, 43 y.o.   MRN: [id number] History of Present Illness: Lakindra is a 43 year old white female, married in for ER follow up.Had RLQ and was seen in ER at [facility] 04/13/18.Had negative labs, GC/CHL negative, CT negative, except fat in small umbilical hernia, US showed 12 mm echogenic area lower uterine segment/cervix, and small physiologic cyst right ovary.  PCP is Dr Hilma Favors.   Current Medications, Allergies, Past Medical History, Past Surgical History, Family History and Social History were reviewed in [employer] record.     Review of Systems: Had pain RLQ 3/19 and seen in ER, told has spot on cervix Feels achy now Needs Wellbutrin refilled    Physical Exam:BP 134/81 (BP Location: Right Arm, Patient Position: Sitting, Cuff Size: Large)   Pulse 96   Temp 98.6 F (37 C)   Ht 5\' 5"  (1.651 m)   Wt 239 lb 8 oz (108.6 kg)   LMP 03/30/2018 Comment: neg preg  BMI 39.85 kg/m  General:  Well developed, well nourished, no acute distress Skin:  Warm and dry Pelvic:  External genitalia is normal in appearance, no lesions.  The vagina is normal in appearance. Urethra has no lesions or masses. The cervix is bulbous, and friable with EC brush, pap with HPV performed.Marland Kitchen  Uterus is felt to be normal size, shape, and contour.  No adnexal masses or tenderness noted.Bladder is non tender, no masses felt.+achty RLQ.  Psych:  No mood changes, alert and cooperative,seems happy Examination chaperoned by Levy Pupa LPN.  Will get [place] in 6 weeks, but if no pain or ache can cancel, is on OCs, and can have break through ovulation at times.   Impression: 1. RLQ abdominal pain   2. Pap smear for cervical cancer screening   3. Friable cervix       Plan: Meds ordered this encounter  Medications  . buPROPion (WELLBUTRIN XL) 150 MG 24 hr tablet    Sig: TAKE 3 TABLETS(450 MG) BY MOUTH EVERY MORNING    Dispense:  90 tablet    Refill:   6    Order Specific Question:   Supervising Provider    Answer:   Florian Buff [[id number]]   Will repeat US in 6 weeks

## 2018-04-21 LAB — CYTOLOGY - PAP
Diagnosis: NEGATIVE
HPV (WINDOPATH): NOT DETECTED

## 2018-04-25 ENCOUNTER — Telehealth: Payer: Self-pay | Admitting: Adult Health

## 2018-04-25 MED ORDER — TOLTERODINE TARTRATE ER 4 MG PO CP24: 4.0000 mg | ORAL_CAPSULE | Freq: Every day | ORAL | 6 refills | Status: DC

## 2018-04-25 NOTE — Telephone Encounter (Signed)
Pt aware pap was negative for malignancy and HPV and refilled detrol

## 2018-04-28 ENCOUNTER — Other Ambulatory Visit: Payer: Self-pay | Admitting: Adult Health

## 2018-05-09 DIAGNOSIS — L718 Other rosacea: Secondary | ICD-10-CM | POA: Diagnosis not present

## 2018-05-15 ENCOUNTER — Encounter: Payer: Self-pay | Admitting: *Deleted

## 2018-05-16 ENCOUNTER — Telehealth: Payer: Self-pay | Admitting: *Deleted

## 2018-05-16 NOTE — Telephone Encounter (Signed)
Pharmacy made aware Wellbutrin approved from 05/15/18-05/14/20

## 2018-05-30 ENCOUNTER — Other Ambulatory Visit: Payer: Self-pay

## 2018-05-30 ENCOUNTER — Encounter: Payer: Self-pay | Admitting: Obstetrics & Gynecology

## 2018-05-30 ENCOUNTER — Ambulatory Visit (INDEPENDENT_AMBULATORY_CARE_PROVIDER_SITE_OTHER): Payer: BLUE CROSS/BLUE SHIELD | Admitting: Obstetrics & Gynecology

## 2018-05-30 VITALS — BP 127/83 | HR 101 | Ht 65.0 in | Wt 239.5 lb

## 2018-05-30 DIAGNOSIS — N3281 Overactive bladder: Secondary | ICD-10-CM | POA: Diagnosis not present

## 2018-05-30 DIAGNOSIS — N3010 Interstitial cystitis (chronic) without hematuria: Secondary | ICD-10-CM

## 2018-05-30 NOTE — Progress Notes (Signed)
Diagnosed with IC: 01/2015  Current Meds:  Elmiron, DMSO Dietary restrictions    Pt states her symptoms have been stable, no exacerbations, although not perfect Wants to continue on this cycle  Blood pressure 127/83, pulse (!) 101, height 5\' 5"  (1.651 m), weight 239 lb 8 oz (108.6 kg), last menstrual period 05/26/2018.    The external urethra meatus was prepped with betadine DMSO 50 cc was instilled in the usual fashion after the bladder was catheterized and emptied completely 50cc was instilled into the bladder without difficulty and the patient tolerated well She will refrain from voiding as long as possible  Follow up in 8 weeks, or as patient requests based on her symptom complex

## 2018-05-31 ENCOUNTER — Other Ambulatory Visit: Payer: BLUE CROSS/BLUE SHIELD

## 2018-06-15 DIAGNOSIS — Z1389 Encounter for screening for other disorder: Secondary | ICD-10-CM | POA: Diagnosis not present

## 2018-06-15 DIAGNOSIS — K130 Diseases of lips: Secondary | ICD-10-CM | POA: Diagnosis not present

## 2018-06-15 DIAGNOSIS — Z6839 Body mass index (BMI) 39.0-39.9, adult: Secondary | ICD-10-CM | POA: Diagnosis not present

## 2018-06-15 DIAGNOSIS — T7840XA Allergy, unspecified, initial encounter: Secondary | ICD-10-CM | POA: Diagnosis not present

## 2018-07-18 ENCOUNTER — Ambulatory Visit: Payer: BLUE CROSS/BLUE SHIELD | Admitting: Obstetrics & Gynecology

## 2018-07-24 ENCOUNTER — Other Ambulatory Visit: Payer: Self-pay | Admitting: Obstetrics & Gynecology

## 2018-07-27 ENCOUNTER — Telehealth: Payer: Self-pay | Admitting: Obstetrics & Gynecology

## 2018-07-27 NOTE — Telephone Encounter (Signed)

## 2018-07-31 ENCOUNTER — Ambulatory Visit: Payer: BLUE CROSS/BLUE SHIELD | Admitting: Obstetrics & Gynecology

## 2018-08-16 ENCOUNTER — Other Ambulatory Visit: Payer: Self-pay | Admitting: Obstetrics & Gynecology

## 2018-08-16 DIAGNOSIS — Z1231 Encounter for screening mammogram for malignant neoplasm of breast: Secondary | ICD-10-CM

## 2018-08-28 ENCOUNTER — Telehealth: Payer: Self-pay | Admitting: Obstetrics & Gynecology

## 2018-08-28 NOTE — Telephone Encounter (Signed)

## 2018-08-29 ENCOUNTER — Ambulatory Visit: Payer: BC Managed Care – PPO | Admitting: Obstetrics & Gynecology

## 2018-09-01 ENCOUNTER — Other Ambulatory Visit: Payer: Self-pay | Admitting: Adult Health

## 2018-09-18 ENCOUNTER — Telehealth: Payer: Self-pay | Admitting: Obstetrics & Gynecology

## 2018-09-18 NOTE — Telephone Encounter (Signed)
Called patient and left message informing her that we are not allowing any visitors or children to come to visit with her at this time and we are requiring a mask to be worn during the visit. Asked if she has had any exposure to anyone suspected or confirmed of having COVID-19 or if she is experiencing any of the following: fever, cough, sob, muscle pain, severe headache, sore throat, diarrhea, loss of taste or smell or ear, nose or throat discomfort to call and reschedule.   °

## 2018-09-19 ENCOUNTER — Other Ambulatory Visit: Payer: Self-pay

## 2018-09-19 ENCOUNTER — Encounter

## 2018-09-19 ENCOUNTER — Ambulatory Visit: Payer: BC Managed Care – PPO | Admitting: Obstetrics & Gynecology

## 2018-09-19 ENCOUNTER — Encounter: Payer: Self-pay | Admitting: Obstetrics & Gynecology

## 2018-09-19 VITALS — BP 133/85 | HR 94 | Ht 65.0 in | Wt 244.0 lb

## 2018-09-19 DIAGNOSIS — N3010 Interstitial cystitis (chronic) without hematuria: Secondary | ICD-10-CM | POA: Diagnosis not present

## 2018-09-19 DIAGNOSIS — N3281 Overactive bladder: Secondary | ICD-10-CM

## 2018-09-19 NOTE — Progress Notes (Signed)
Diagnosed with IC: 01/2015  Current Meds:  elmiron + DMSO Dietary restrictions    Pt states her symptoms have been stable, no exacerbations, although not perfect Wants to continue on this cycle  Blood pressure 133/85, pulse 94, height 5\' 5"  (1.651 m), weight 244 lb (110.7 kg).    The external urethra meatus was prepped with betadine DMSO 50 cc was instilled in the usual fashion after the bladder was catheterized and emptied completely 50cc was instilled into the bladder without difficulty and the patient tolerated well She will refrain from voiding as long as possible  Follow up in 8 weeks, or as patient requests based on her symptom complex

## 2018-10-03 ENCOUNTER — Ambulatory Visit
Admission: RE | Admit: 2018-10-03 | Discharge: 2018-10-03 | Disposition: A | Discharge status: Home / Self Care | Payer: BC Managed Care – PPO | Source: Ambulatory Visit | Attending: Obstetrics & Gynecology | Admitting: Obstetrics & Gynecology

## 2018-10-03 ENCOUNTER — Other Ambulatory Visit: Payer: Self-pay

## 2018-10-03 DIAGNOSIS — Z1231 Encounter for screening mammogram for malignant neoplasm of breast: Secondary | ICD-10-CM

## 2018-10-23 ENCOUNTER — Other Ambulatory Visit: Payer: Self-pay | Admitting: Adult Health

## 2018-11-16 ENCOUNTER — Telehealth: Payer: Self-pay | Admitting: Obstetrics & Gynecology

## 2018-11-16 NOTE — Telephone Encounter (Signed)

## 2018-11-20 ENCOUNTER — Ambulatory Visit: Payer: BC Managed Care – PPO | Admitting: Obstetrics & Gynecology

## 2018-11-27 ENCOUNTER — Telehealth: Payer: Self-pay | Admitting: Obstetrics & Gynecology

## 2018-11-27 NOTE — Telephone Encounter (Signed)

## 2018-11-28 ENCOUNTER — Other Ambulatory Visit: Payer: Self-pay

## 2018-11-28 ENCOUNTER — Encounter: Payer: Self-pay | Admitting: Obstetrics & Gynecology

## 2018-11-28 ENCOUNTER — Ambulatory Visit: Payer: BC Managed Care – PPO | Admitting: Obstetrics & Gynecology

## 2018-11-28 VITALS — BP 120/82 | HR 100 | Ht 65.0 in | Wt 244.0 lb

## 2018-11-28 DIAGNOSIS — N3010 Interstitial cystitis (chronic) without hematuria: Secondary | ICD-10-CM

## 2018-11-28 DIAGNOSIS — N3281 Overactive bladder: Secondary | ICD-10-CM

## 2018-11-28 NOTE — Progress Notes (Signed)
Diagnosed with IC: 01/2016  Current Meds:  Elmiron, DMSO Dietary restrictions    Pt states her symptoms have been stable, no exacerbations, although not perfect Wants to continue on this cycle  Blood pressure 120/82, pulse 100, height 5\' 5"  (1.651 m), weight 244 lb (110.7 kg).    The external urethra meatus was prepped with betadine DMSO 50 cc was instilled in the usual fashion after the bladder was catheterized and emptied completely 50cc was instilled into the bladder without difficulty and the patient tolerated well She will refrain from voiding as long as possible  Follow up in 50 weeks, or as patient requests based on her symptom complex

## 2018-12-08 DIAGNOSIS — Z719 Counseling, unspecified: Secondary | ICD-10-CM | POA: Diagnosis not present

## 2018-12-08 DIAGNOSIS — Z6839 Body mass index (BMI) 39.0-39.9, adult: Secondary | ICD-10-CM | POA: Diagnosis not present

## 2018-12-08 DIAGNOSIS — F419 Anxiety disorder, unspecified: Secondary | ICD-10-CM | POA: Diagnosis not present

## 2018-12-08 DIAGNOSIS — R002 Palpitations: Secondary | ICD-10-CM | POA: Diagnosis not present

## 2018-12-08 DIAGNOSIS — R7309 Other abnormal glucose: Secondary | ICD-10-CM | POA: Diagnosis not present

## 2018-12-08 DIAGNOSIS — G43909 Migraine, unspecified, not intractable, without status migrainosus: Secondary | ICD-10-CM | POA: Diagnosis not present

## 2018-12-08 DIAGNOSIS — Z0001 Encounter for general adult medical examination with abnormal findings: Secondary | ICD-10-CM | POA: Diagnosis not present

## 2018-12-14 ENCOUNTER — Other Ambulatory Visit: Payer: Self-pay

## 2018-12-14 DIAGNOSIS — Z20822 Contact with and (suspected) exposure to covid-19: Secondary | ICD-10-CM

## 2018-12-16 LAB — NOVEL CORONAVIRUS, NAA: SARS-CoV-2, NAA: NOT DETECTED

## 2018-12-18 DIAGNOSIS — J329 Chronic sinusitis, unspecified: Secondary | ICD-10-CM | POA: Diagnosis not present

## 2018-12-18 DIAGNOSIS — J9801 Acute bronchospasm: Secondary | ICD-10-CM | POA: Diagnosis not present

## 2018-12-18 DIAGNOSIS — Z6839 Body mass index (BMI) 39.0-39.9, adult: Secondary | ICD-10-CM | POA: Diagnosis not present

## 2019-01-28 ENCOUNTER — Other Ambulatory Visit: Payer: Self-pay | Admitting: Women's Health

## 2019-01-30 ENCOUNTER — Other Ambulatory Visit: Payer: Self-pay

## 2019-01-30 ENCOUNTER — Ambulatory Visit: Payer: BC Managed Care – PPO | Attending: Internal Medicine

## 2019-01-30 DIAGNOSIS — Z20822 Contact with and (suspected) exposure to covid-19: Secondary | ICD-10-CM | POA: Diagnosis not present

## 2019-02-01 ENCOUNTER — Ambulatory Visit: Payer: BC Managed Care – PPO | Admitting: Obstetrics & Gynecology

## 2019-02-01 LAB — NOVEL CORONAVIRUS, NAA: SARS-CoV-2, NAA: NOT DETECTED

## 2019-02-15 ENCOUNTER — Other Ambulatory Visit: Payer: Self-pay

## 2019-02-15 ENCOUNTER — Ambulatory Visit (INDEPENDENT_AMBULATORY_CARE_PROVIDER_SITE_OTHER): Payer: BC Managed Care – PPO | Admitting: Obstetrics & Gynecology

## 2019-02-15 VITALS — BP 132/83 | HR 93 | Ht 65.0 in | Wt 241.0 lb

## 2019-02-15 DIAGNOSIS — N3010 Interstitial cystitis (chronic) without hematuria: Secondary | ICD-10-CM | POA: Diagnosis not present

## 2019-02-15 DIAGNOSIS — N3281 Overactive bladder: Secondary | ICD-10-CM | POA: Diagnosis not present

## 2019-02-15 NOTE — Progress Notes (Signed)
Diagnosed with IC: several years  Current Meds:  elmiron + DMSO Dietary restrictions    Pt states her symptoms have been stable, no exacerbations, although not perfect Wants to continue on this cycle  Blood pressure 132/83, pulse 93, height 5\' 5"  (1.651 m), weight 241 lb (109.3 kg).    The external urethra meatus was prepped with betadine DMSO 50 cc was instilled in the usual fashion after the bladder was catheterized and emptied completely 50cc was instilled into the bladder without difficulty and the patient tolerated well She will refrain from voiding as long as possible  Follow up in 10 weeks, or as patient requests based on her symptom complex

## 2019-02-24 ENCOUNTER — Other Ambulatory Visit: Payer: Self-pay | Admitting: Obstetrics & Gynecology

## 2019-02-28 ENCOUNTER — Other Ambulatory Visit: Payer: Self-pay | Admitting: Adult Health

## 2019-04-30 ENCOUNTER — Other Ambulatory Visit: Payer: Self-pay | Admitting: Adult Health

## 2019-04-30 ENCOUNTER — Ambulatory Visit: Payer: BC Managed Care – PPO | Admitting: Obstetrics & Gynecology

## 2019-05-07 ENCOUNTER — Ambulatory Visit: Payer: BC Managed Care – PPO | Admitting: Obstetrics & Gynecology

## 2019-05-07 ENCOUNTER — Encounter: Payer: Self-pay | Admitting: Obstetrics & Gynecology

## 2019-05-07 ENCOUNTER — Other Ambulatory Visit: Payer: Self-pay

## 2019-05-07 VITALS — BP 143/87 | HR 94 | Ht 65.0 in | Wt 246.0 lb

## 2019-05-07 DIAGNOSIS — N3281 Overactive bladder: Secondary | ICD-10-CM

## 2019-05-07 DIAGNOSIS — J019 Acute sinusitis, unspecified: Secondary | ICD-10-CM | POA: Diagnosis not present

## 2019-05-07 DIAGNOSIS — N3010 Interstitial cystitis (chronic) without hematuria: Secondary | ICD-10-CM

## 2019-05-07 DIAGNOSIS — Z6839 Body mass index (BMI) 39.0-39.9, adult: Secondary | ICD-10-CM | POA: Diagnosis not present

## 2019-05-07 NOTE — Progress Notes (Signed)
Diagnosed with IC: 2018  Current Meds:  DMSO + elmiron Dietary restrictions    Pt states her symptoms have been stable, no exacerbations, although not perfect Wants to continue on this cycle  Blood pressure (!) 143/87, pulse 94, height 5\' 5"  (1.651 m), weight 246 lb (111.6 kg).    The external urethra meatus was prepped with betadine DMSO 50 cc was instilled in the usual fashion after the bladder was catheterized and emptied completely 50cc was instilled into the bladder without difficulty and the patient tolerated well She will refrain from voiding as long as possible  Follow up in 8 weeks, or as patient requests based on her symptom complex

## 2019-07-02 ENCOUNTER — Ambulatory Visit: Payer: BC Managed Care – PPO | Admitting: Obstetrics & Gynecology

## 2019-07-18 ENCOUNTER — Other Ambulatory Visit: Payer: Self-pay | Admitting: Adult Health

## 2019-07-19 ENCOUNTER — Ambulatory Visit: Payer: BC Managed Care – PPO | Admitting: Obstetrics & Gynecology

## 2019-07-23 ENCOUNTER — Ambulatory Visit: Payer: BC Managed Care – PPO | Admitting: Obstetrics & Gynecology

## 2019-07-23 ENCOUNTER — Encounter: Payer: Self-pay | Admitting: Obstetrics & Gynecology

## 2019-07-23 VITALS — BP 123/79 | HR 94 | Ht 65.0 in | Wt 246.0 lb

## 2019-07-23 DIAGNOSIS — N3281 Overactive bladder: Secondary | ICD-10-CM

## 2019-07-23 DIAGNOSIS — N3010 Interstitial cystitis (chronic) without hematuria: Secondary | ICD-10-CM | POA: Diagnosis not present

## 2019-07-23 NOTE — Progress Notes (Signed)
Diagnosed with IC: 2018  Current Meds:  DMSO + elmiron Dietary restrictions    Pt states her symptoms have been stable, no exacerbations, although not perfect Wants to continue on this cycle  Blood pressure 123/79, pulse 94, height 5\' 5"  (1.651 m), weight 246 lb (111.6 kg).    The external urethra meatus was prepped with betadine DMSO 50 cc was instilled in the usual fashion after the bladder was catheterized and emptied completely 50cc was instilled into the bladder without difficulty and the patient tolerated well She will refrain from voiding as long as possible  Follow up in 8 weeks, or as patient requests based on her symptom complex

## 2019-07-28 ENCOUNTER — Other Ambulatory Visit: Payer: Self-pay | Admitting: Obstetrics & Gynecology

## 2019-07-28 ENCOUNTER — Other Ambulatory Visit: Payer: Self-pay | Admitting: Adult Health

## 2019-09-17 ENCOUNTER — Encounter: Payer: Self-pay | Admitting: Obstetrics & Gynecology

## 2019-09-17 ENCOUNTER — Ambulatory Visit: Payer: BC Managed Care – PPO | Admitting: Obstetrics & Gynecology

## 2019-09-17 VITALS — BP 139/85 | HR 100 | Ht 65.0 in | Wt 248.0 lb

## 2019-09-17 DIAGNOSIS — N3010 Interstitial cystitis (chronic) without hematuria: Secondary | ICD-10-CM | POA: Diagnosis not present

## 2019-09-17 NOTE — Progress Notes (Signed)
Diagnosed with IC: 2018  Current Meds:  Elmiron, DMSO Dietary restrictions    Pt states her symptoms have been stable, no exacerbations, although not perfect Wants to continue on this cycle  Blood pressure 139/85, pulse 100, height 5\' 5"  (1.651 m), weight 248 lb (112.5 kg).    The external urethra meatus was prepped with betadine DMSO 50 cc was instilled in the usual fashion after the bladder was catheterized and emptied completely 50cc was instilled into the bladder without difficulty and the patient tolerated well She will refrain from voiding as long as possible  Follow up in 8 weeks, or as patient requests based on her symptom complex

## 2019-10-05 ENCOUNTER — Other Ambulatory Visit: Payer: Self-pay | Admitting: Adult Health

## 2019-10-14 ENCOUNTER — Other Ambulatory Visit: Payer: Self-pay | Admitting: Adult Health

## 2019-10-22 DIAGNOSIS — J019 Acute sinusitis, unspecified: Secondary | ICD-10-CM | POA: Diagnosis not present

## 2019-10-22 DIAGNOSIS — J309 Allergic rhinitis, unspecified: Secondary | ICD-10-CM | POA: Diagnosis not present

## 2019-10-22 DIAGNOSIS — Z6839 Body mass index (BMI) 39.0-39.9, adult: Secondary | ICD-10-CM | POA: Diagnosis not present

## 2019-10-29 ENCOUNTER — Other Ambulatory Visit: Payer: Self-pay | Admitting: Obstetrics & Gynecology

## 2019-10-29 DIAGNOSIS — Z1231 Encounter for screening mammogram for malignant neoplasm of breast: Secondary | ICD-10-CM

## 2019-11-15 ENCOUNTER — Ambulatory Visit
Admission: RE | Admit: 2019-11-15 | Discharge: 2019-11-15 | Disposition: A | Discharge status: Home / Self Care | Payer: BC Managed Care – PPO | Source: Ambulatory Visit | Attending: Obstetrics & Gynecology | Admitting: Obstetrics & Gynecology

## 2019-11-15 ENCOUNTER — Other Ambulatory Visit: Payer: Self-pay

## 2019-11-15 DIAGNOSIS — Z1231 Encounter for screening mammogram for malignant neoplasm of breast: Secondary | ICD-10-CM

## 2019-11-19 ENCOUNTER — Ambulatory Visit: Payer: BC Managed Care – PPO | Admitting: Obstetrics & Gynecology

## 2019-11-19 ENCOUNTER — Encounter: Payer: Self-pay | Admitting: Obstetrics & Gynecology

## 2019-11-19 VITALS — BP 128/77 | HR 94 | Ht 65.0 in | Wt 238.5 lb

## 2019-11-19 DIAGNOSIS — N3010 Interstitial cystitis (chronic) without hematuria: Secondary | ICD-10-CM

## 2019-11-19 NOTE — Progress Notes (Signed)
Diagnosed with IC: 2018  Current Meds:  elmiron DMSO Dietary restrictions    Pt states her symptoms have been stable, no exacerbations, although not perfect Wants to continue on this cycle  Blood pressure 128/77, pulse 94, height 5\' 5"  (1.651 m), weight 238 lb 8 oz (108.2 kg), last menstrual period 11/10/2019.    The external urethra meatus was prepped with betadine DMSO 50 cc was instilled in the usual fashion after the bladder was catheterized and emptied completely 50cc was instilled into the bladder without difficulty and the patient tolerated well She will refrain from voiding as long as possible  Follow up in 8 weeks, or as patient requests based on her symptom complex

## 2019-11-28 DIAGNOSIS — M5410 Radiculopathy, site unspecified: Secondary | ICD-10-CM | POA: Diagnosis not present

## 2019-11-28 DIAGNOSIS — Z6839 Body mass index (BMI) 39.0-39.9, adult: Secondary | ICD-10-CM | POA: Diagnosis not present

## 2019-12-11 DIAGNOSIS — Z0001 Encounter for general adult medical examination with abnormal findings: Secondary | ICD-10-CM | POA: Diagnosis not present

## 2019-12-11 DIAGNOSIS — F419 Anxiety disorder, unspecified: Secondary | ICD-10-CM | POA: Diagnosis not present

## 2019-12-11 DIAGNOSIS — E559 Vitamin D deficiency, unspecified: Secondary | ICD-10-CM | POA: Diagnosis not present

## 2019-12-11 DIAGNOSIS — Z6838 Body mass index (BMI) 38.0-38.9, adult: Secondary | ICD-10-CM | POA: Diagnosis not present

## 2019-12-11 DIAGNOSIS — G43909 Migraine, unspecified, not intractable, without status migrainosus: Secondary | ICD-10-CM | POA: Diagnosis not present

## 2019-12-11 DIAGNOSIS — R7309 Other abnormal glucose: Secondary | ICD-10-CM | POA: Diagnosis not present

## 2019-12-23 ENCOUNTER — Other Ambulatory Visit: Payer: Self-pay | Admitting: Adult Health

## 2020-01-03 DIAGNOSIS — R059 Cough, unspecified: Secondary | ICD-10-CM | POA: Diagnosis not present

## 2020-01-03 DIAGNOSIS — B9789 Other viral agents as the cause of diseases classified elsewhere: Secondary | ICD-10-CM | POA: Diagnosis not present

## 2020-01-03 DIAGNOSIS — J028 Acute pharyngitis due to other specified organisms: Secondary | ICD-10-CM | POA: Diagnosis not present

## 2020-01-03 DIAGNOSIS — J0100 Acute maxillary sinusitis, unspecified: Secondary | ICD-10-CM | POA: Diagnosis not present

## 2020-01-22 ENCOUNTER — Ambulatory Visit: Payer: BC Managed Care – PPO | Admitting: Obstetrics & Gynecology

## 2020-02-10 IMAGING — CT CT ABDOMEN AND PELVIS WITH CONTRAST
2 of 5 series · 16 of 46 positions shown, 18 images · IV contrast (Isovue)
Comparison: CT abdomen and pelvis 05/22/2005.

CLINICAL DATA: Acute onset right lower quadrant pain 1 hour ago.

EXAM:
CT ABDOMEN AND PELVIS WITH CONTRAST
TECHNIQUE: Multidetector CT imaging of the abdomen and pelvis was performed
using the standard protocol following bolus administration of
intravenous contrast.
CONTRAST:  100 mL OMNIPAQUE IOHEXOL 300 MG/ML  SOLN

[Series 2: axial st · axial · 0.71mm/px · z∈[+301,+756]mm · 13 of 105 slices shown, 15 images]
[im 7/105  soft-tissue]
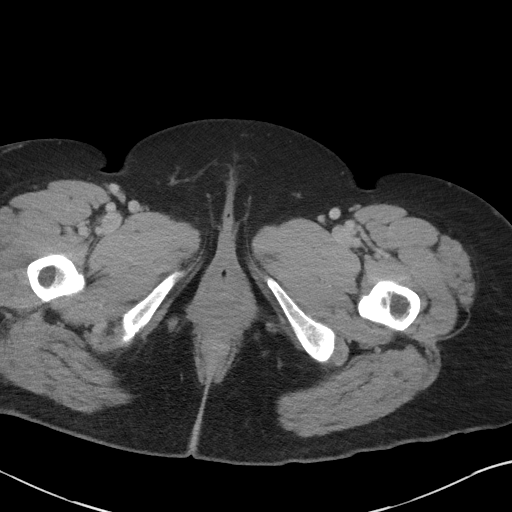
[im 7/105  bone]
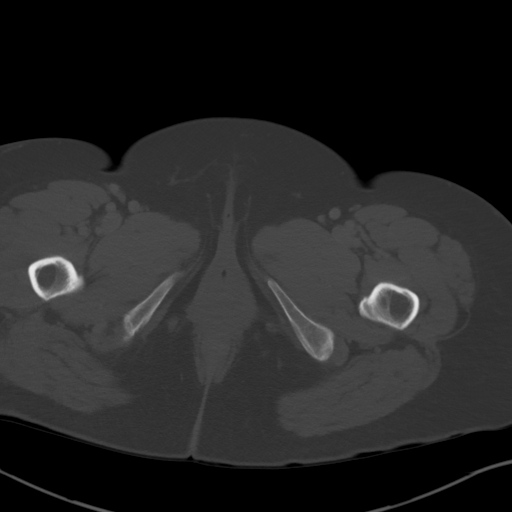
[im 14/105  soft-tissue]
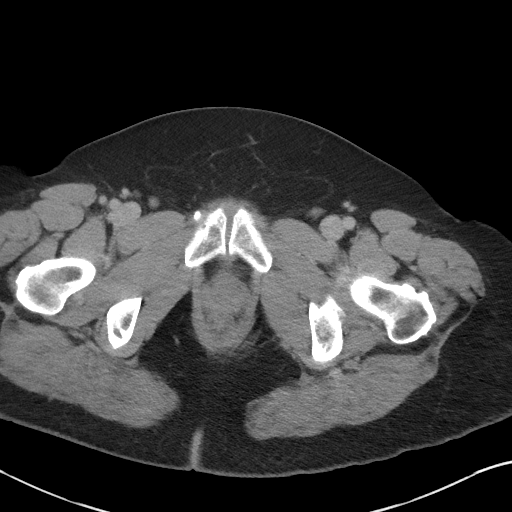
[im 20/105  soft-tissue]
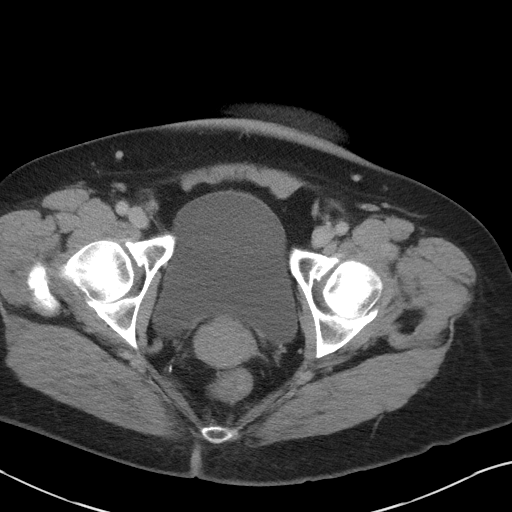
[im 33/105  soft-tissue]
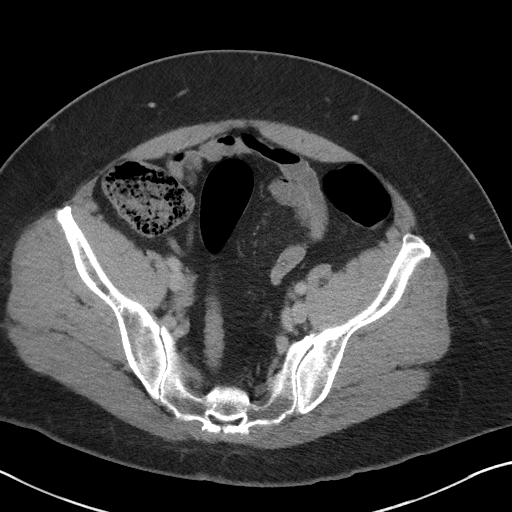
[im 40/105  soft-tissue]
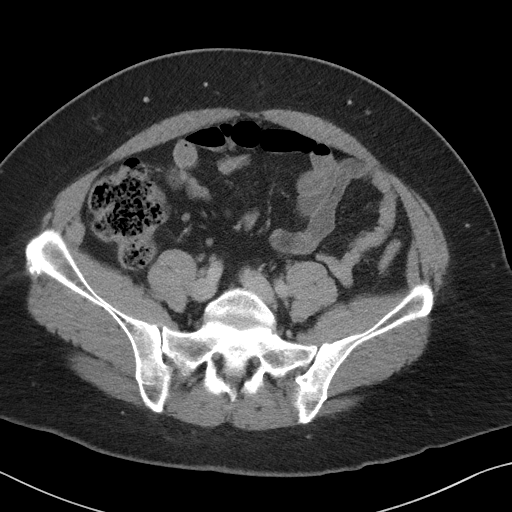
[im 46/105  soft-tissue]
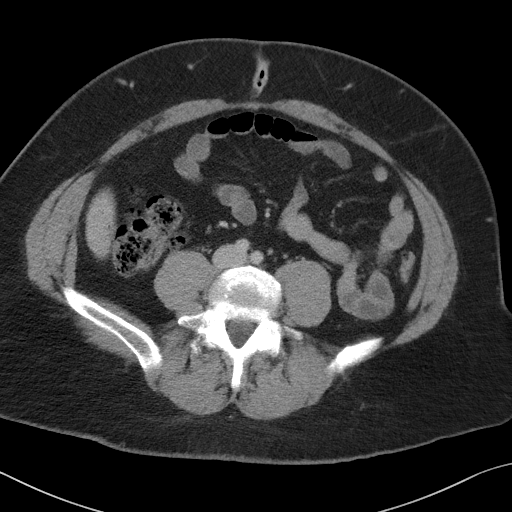
[im 53/105  soft-tissue]
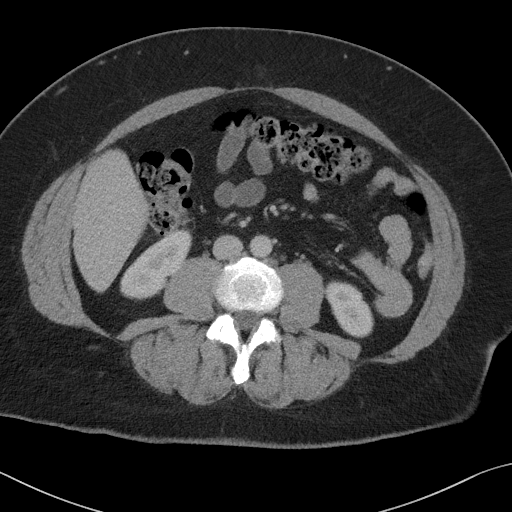
[im 59/105  soft-tissue]
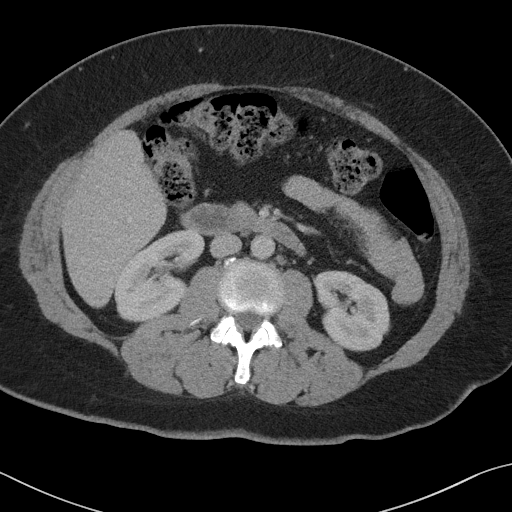
[im 66/105  soft-tissue]
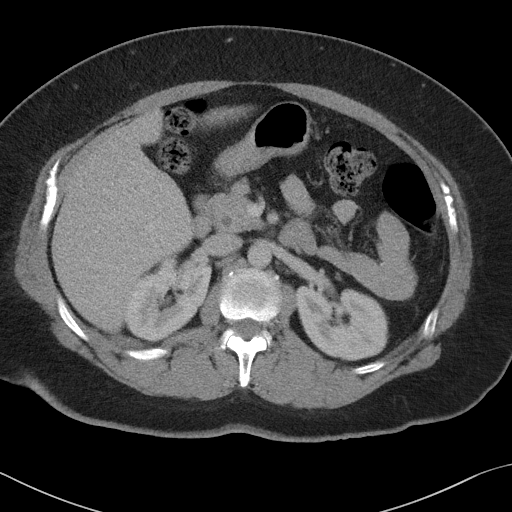
[im 66/105  bone]
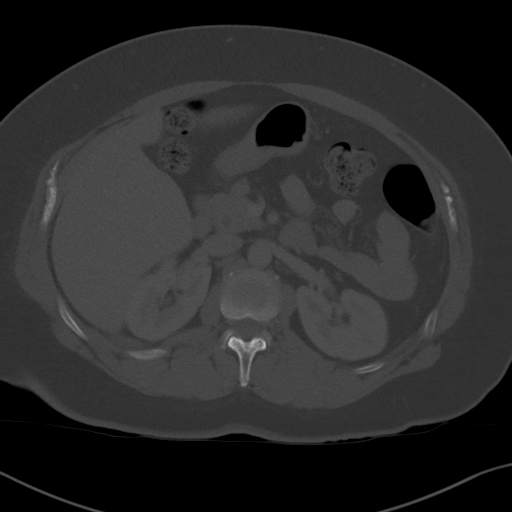
[im 72/105  soft-tissue]
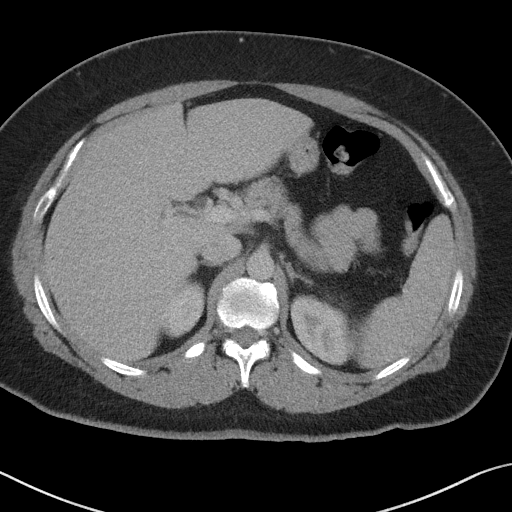
[im 85/105  soft-tissue]
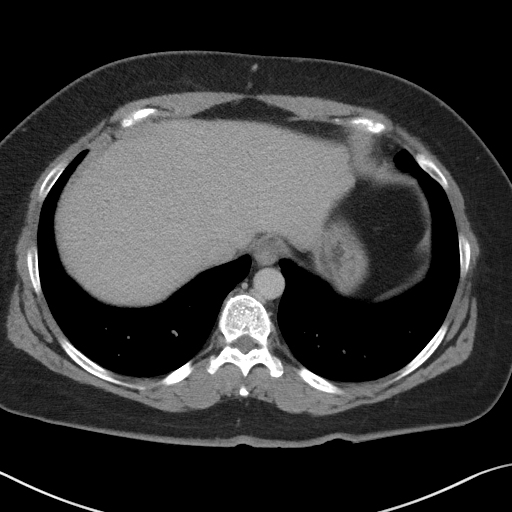
[im 92/105  soft-tissue]
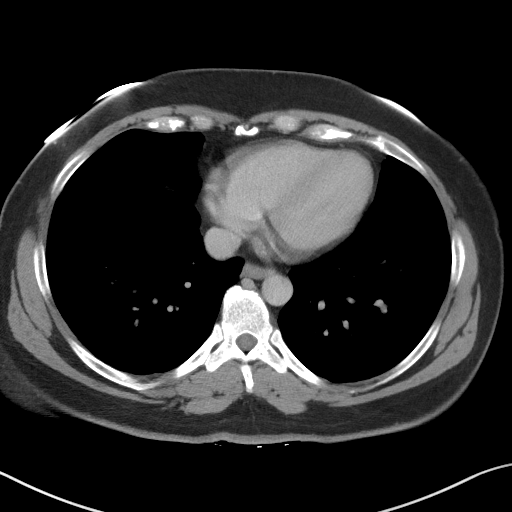
[im 98/105  soft-tissue]
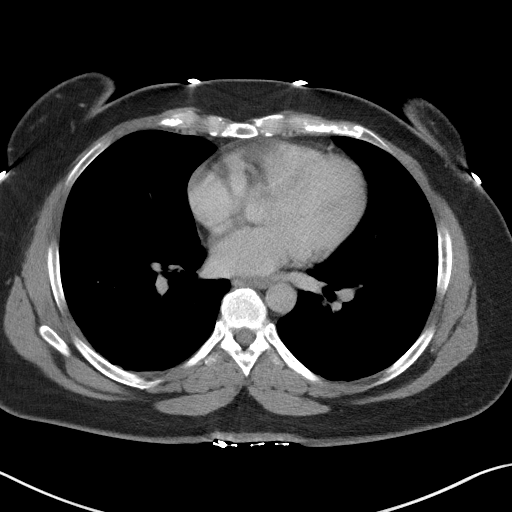

[Series 6: coronal st · coronal · 0.78mm/px · 3 of 97 slices shown]
[im 33/97  soft-tissue]
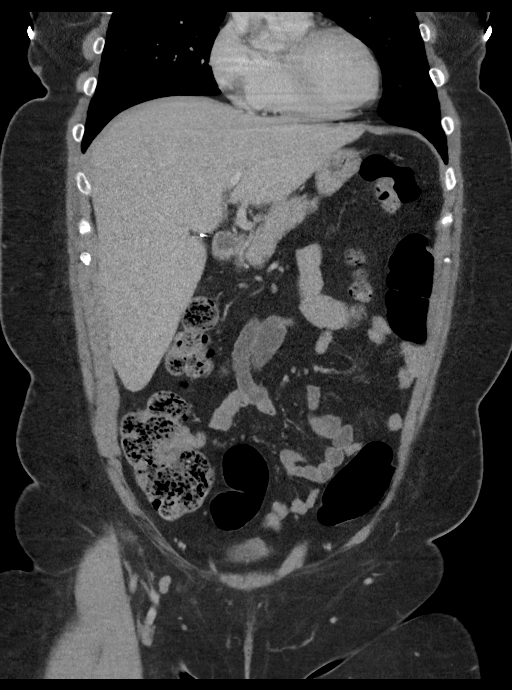
[im 43/97  soft-tissue]
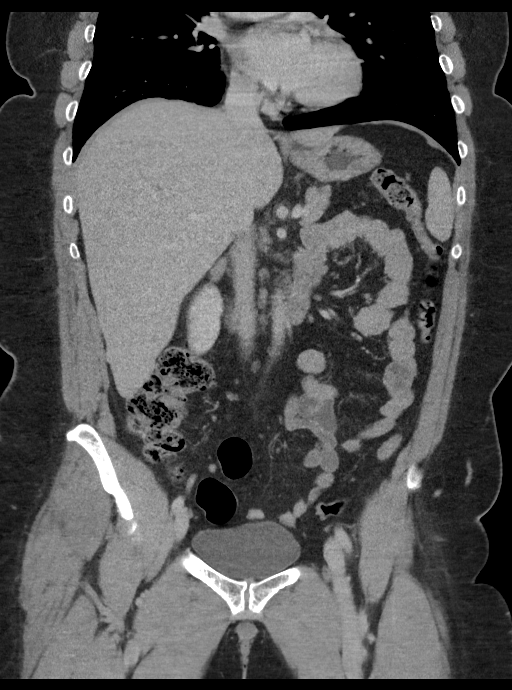
[im 54/97  soft-tissue]
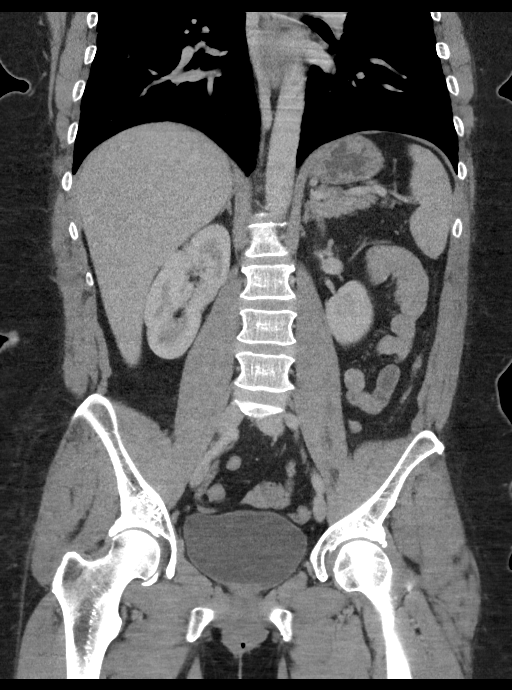

[16 of 46 positions shown; findings below may reference images not displayed]

FINDINGS: Lower chest: Mild dependent atelectasis noted. No pleural or
pericardial effusion.

Hepatobiliary: No focal liver abnormality is seen. Status post
cholecystectomy. No biliary dilatation.

Pancreas: Unremarkable. No pancreatic ductal dilatation or
surrounding inflammatory changes.

Spleen: Normal in size without focal abnormality.

Adrenals/Urinary Tract: Adrenal glands are unremarkable. Kidneys are
normal, without renal calculi, focal lesion, or hydronephrosis.
Bladder is unremarkable.

Stomach/Bowel: Stomach is within normal limits. Appendix appears
normal. No evidence of bowel wall thickening, distention, or
inflammatory changes.

Vascular/Lymphatic: No significant vascular findings are present. No
enlarged abdominal or pelvic lymph nodes.

Reproductive: Uterus and bilateral adnexa are unremarkable.

Other: Small fat containing umbilical hernia noted.

Musculoskeletal: Negative.
IMPRESSION: Negative for appendicitis. No acute abnormality or finding to
explain the patient's symptoms.

Small fat containing umbilical hernia.

Status post cholecystectomy.

## 2020-02-18 ENCOUNTER — Ambulatory Visit (INDEPENDENT_AMBULATORY_CARE_PROVIDER_SITE_OTHER): Payer: BC Managed Care – PPO | Admitting: Obstetrics & Gynecology

## 2020-02-18 ENCOUNTER — Other Ambulatory Visit: Payer: Self-pay

## 2020-02-18 ENCOUNTER — Encounter: Payer: Self-pay | Admitting: Obstetrics & Gynecology

## 2020-02-18 VITALS — BP 130/83 | HR 95 | Wt 236.0 lb

## 2020-02-18 DIAGNOSIS — N3010 Interstitial cystitis (chronic) without hematuria: Secondary | ICD-10-CM | POA: Diagnosis not present

## 2020-02-18 NOTE — Progress Notes (Signed)
Diagnosed with IC: 01/2016  Current Meds:  DMSO, elmiron Dietary restrictions    Pt states her symptoms have been stable, no exacerbations, although not perfect Wants to continue on this cycle  Blood pressure 130/83, pulse 95, weight 236 lb (107 kg), last menstrual period 01/09/2020.    The external urethra meatus was prepped with betadine DMSO 50 cc was instilled in the usual fashion after the bladder was catheterized and emptied completely 50cc was instilled into the bladder without difficulty and the patient tolerated well She will refrain from voiding as long as possible  Follow up in 8 weeks, or as patient requests based on her symptom complex

## 2020-02-19 ENCOUNTER — Other Ambulatory Visit: Payer: Self-pay | Admitting: *Deleted

## 2020-02-19 DIAGNOSIS — Z1211 Encounter for screening for malignant neoplasm of colon: Secondary | ICD-10-CM

## 2020-02-25 ENCOUNTER — Other Ambulatory Visit: Payer: Self-pay | Admitting: Adult Health

## 2020-02-26 ENCOUNTER — Encounter: Payer: Self-pay | Admitting: Internal Medicine

## 2020-02-29 DIAGNOSIS — Z20822 Contact with and (suspected) exposure to covid-19: Secondary | ICD-10-CM | POA: Diagnosis not present

## 2020-02-29 DIAGNOSIS — B349 Viral infection, unspecified: Secondary | ICD-10-CM | POA: Diagnosis not present

## 2020-03-24 ENCOUNTER — Other Ambulatory Visit: Payer: Self-pay | Admitting: Obstetrics & Gynecology

## 2020-04-08 ENCOUNTER — Other Ambulatory Visit: Payer: Self-pay | Admitting: Internal Medicine

## 2020-04-08 ENCOUNTER — Other Ambulatory Visit: Payer: Self-pay

## 2020-04-08 ENCOUNTER — Ambulatory Visit (INDEPENDENT_AMBULATORY_CARE_PROVIDER_SITE_OTHER): Payer: BC Managed Care – PPO | Admitting: Gastroenterology

## 2020-04-08 ENCOUNTER — Encounter: Payer: Self-pay | Admitting: Gastroenterology

## 2020-04-08 ENCOUNTER — Telehealth: Payer: Self-pay

## 2020-04-08 VITALS — BP 132/81 | HR 90 | Temp 97.1°F | Ht 65.0 in | Wt 238.6 lb

## 2020-04-08 DIAGNOSIS — Z800 Family history of malignant neoplasm of digestive organs: Secondary | ICD-10-CM

## 2020-04-08 DIAGNOSIS — Z1211 Encounter for screening for malignant neoplasm of colon: Secondary | ICD-10-CM

## 2020-04-08 DIAGNOSIS — K5900 Constipation, unspecified: Secondary | ICD-10-CM | POA: Diagnosis not present

## 2020-04-08 MED ORDER — LINACLOTIDE 290 MCG PO CAPS: ORAL_CAPSULE | ORAL | 0 refills | Status: DC

## 2020-04-08 MED ORDER — SUPREP BOWEL PREP KIT 17.5-3.13-1.6 GM/177ML PO SOLN: 1.0000 | ORAL | 0 refills | Status: DC

## 2020-04-08 NOTE — Telephone Encounter (Signed)
Called pt, TCS w/Propofol ASA 2 scheduled with Dr. Abbey Chatters 05/02/20--AM. Rx for prep sent to pharmacy. She was covid positive at [facility] 02/29/20, no covid test needed prior to procedure since positive within 90 days. Pt sent covid result via MyChart. Result emailed to endo scheduler and sent to be scanned into chart. Procedure instructions mailed to pt.

## 2020-04-08 NOTE — Patient Instructions (Addendum)
1. Colonoscopy as scheduled.  Please see separate instructions. 2. Start Linzess 270mcg one capsule daily to every other day one week before your colonoscopy to assist with bowel preparation. If you have multiple loose stools, you can skip a day.  3. Four days before your colonoscopy, transition to soft foods. This will also make bowel prep easier for you.    Soft-Food Eating Plan A soft-food eating plan includes foods that are safe and easy to chew and swallow. Your health care provider or dietitian can help you find foods and flavors that fit into this plan. Follow this plan until your health care provider or dietitian says it is safe to start eating other foods and food textures. What are tips for following this plan? General guidelines  Take small bites of food, or cut food into pieces about  inch or smaller. Bite-sized pieces of food are easier to chew and swallow.  Eat moist foods. Avoid overly dry foods.  Avoid foods that: ? Are difficult to swallow, such as dry, chunky, crispy, or sticky foods. ? Are difficult to chew, such as hard, tough, or stringy foods. ? Contain nuts, seeds, or fruits.  Follow instructions from your dietitian about the types of liquids that are safe for you to swallow. You may be allowed to have: ? Thick liquids only. This includes only liquids that are thicker than honey. ? Thin and thick liquids. This includes all beverages and foods that become liquid at room temperature.  To make thick liquids: ? Purchase a commercial liquid thickening powder. These are available at grocery stores and pharmacies. ? Mix the thickener into liquids according to instructions on the label. ? Purchase ready-made thickened liquids. ? Thicken soup by pureeing, straining to remove chunks, and adding flour, potato flakes, or corn starch. ? Add commercial thickener to foods that become liquid at room temperature, such as milk shakes, yogurt, ice cream, gelatin, and sherbet.  Ask  your health care provider whether you need to take a fiber supplement.   Cooking  Cook meats so they stay tender and moist. Use methods like braising, stewing, or baking in liquid.  Cook vegetables and fruit until they are soft enough to be mashed with a fork.  Peel soft, fresh fruits such as peaches, nectarines, and melons.  When making soup, make sure chunks of meat and vegetables are smaller than  inch.  Reheat leftover foods slowly so that a tough crust does not form. What foods are allowed? The items listed below may not be a complete list. Talk with your dietitian about what dietary choices are best for you. Grains Breads, muffins, pancakes, or waffles moistened with syrup, jelly, or butter. Dry cereals well-moistened with milk. Moist, cooked cereals. Well-cooked pasta and rice. Vegetables All soft-cooked vegetables. Shredded lettuce. Fruits All canned and cooked fruits. Soft, peeled fresh fruits. Strawberries. Dairy Milk. Cream. Yogurt. Cottage cheese. Soft cheese without the rind. Meats and other protein foods Tender, moist ground meat, poultry, or fish. Meat cooked in gravy or sauces. Eggs. Sweets and desserts Ice cream. Milk shakes. Sherbet. Pudding. Fats and oils Butter. Margarine. Olive, canola, sunflower, and grapeseed oil. Smooth salad dressing. Smooth cream cheese. Mayonnaise. Gravy. What foods are not allowed? The items listed bemay not be a complete list. Talk with your dietitian about what dietary choices are best for you. Grains Coarse or dry cereals, such as bran, granola, and shredded wheat. Tough or chewy crusty breads, such as [place] bread or baguettes. Breads with nuts, seeds,  or fruit. Vegetables All raw vegetables. Cooked corn. Cooked vegetables that are tough or stringy. Tough, crisp, fried potatoes and potato skins. Fruits Fresh fruits with skins or seeds, or both, such as apples, pears, and grapes. Stringy, high-pulp fruits, such as papaya, pineapple,  coconut, and mango. Fruit leather and all dried fruit. Dairy Yogurt with nuts or coconut. Meats and other protein foods Hard, dry sausages. Dry meat, poultry, or fish. Meats with gristle. Fish with bones. Fried meat or fish. Lunch meat and hotdogs. Nuts and seeds. Chunky peanut butter or other nut butters. Sweets and desserts Cakes or cookies that are very dry or chewy. Desserts with dried fruit, nuts, or coconut. Fried pastries. Very rich pastries. Fats and oils Cream cheese with fruit or nuts. Salad dressings with seeds or chunks. Summary  A soft-food eating plan includes foods that are safe and easy to swallow. Generally, the foods should be soft enough to be mashed with a fork.  Avoid foods that are dry, hard to chew, crunchy, sticky, stringy, or crispy.  Ask your health care provider whether you need to thicken your liquids and if you need to take a fiber supplement. This information is not intended to replace advice given to you by your health care provider. Make sure you discuss any questions you have with your health care provider. Document Revised: 05/04/2018 Document Reviewed: 03/16/2016 Elsevier Patient Education  2021 [employer].

## 2020-04-08 NOTE — Progress Notes (Signed)
Primary Care Physician:  Sharilyn Sites, MD  Primary Gastroenterologist:  Elon Alas. Abbey Chatters, DO   Chief Complaint  Patient presents with  . Consult    TCS never done prior. Mom hx colon cancer diagnosed age 46    HPI:  Amanda Blackwell is a 45 y.o. female here at the request of Dr. Tania Ade for screening colonoscopy. Mother had colon cancer, diagnosed at age 61.  Chronic constipation for years dating back to her 17s. May go couple of days without a BM and then have a day of diarrhea. No melena, brbpr. No abdominal pain unless constipation. Has had some nausea/vomiting since being on Crestor, had to wean back to three times a week. Doing better. Some heartburn at times, takes omeprazole only as needed for a couple of years.  No dysphagia.   She tested positive for Covid 02/29/20.   Current Outpatient Medications  Medication Sig Dispense Refill  . ALPRAZolam (XANAX) 0.5 MG tablet TAKE ONE TABLET BY MOUTH AT BEDTIME AS NEEDED. 30 tablet 5  . Ascorbic Acid (VITAMIN C) 1000 MG tablet Take 1,000 mg by mouth daily.    Marland Kitchen buPROPion (WELLBUTRIN XL) 150 MG 24 hr tablet TAKE 3 TABLETS(450 MG) BY MOUTH EVERY MORNING 270 tablet 2  . cetirizine (ZYRTEC) 10 MG tablet Take 10 mg by mouth daily.    Marland Kitchen ELMIRON 100 MG capsule TAKE 2 CAPSULES BY MOUTH TWICE A DAY 120 capsule 11  . PROAIR HFA 108 (90 Base) MCG/ACT inhaler as needed.   11  . rosuvastatin (CRESTOR) 10 MG tablet Take 10 mg by mouth at bedtime.    . tolterodine (DETROL LA) 4 MG 24 hr capsule TAKE 1 CAPSULE BY MOUTH EVERY DAY 90 capsule 1  . VELIVET 0.1/0.125/0.15 -0.025 MG tablet TAKE 1 TABLET BY MOUTH EVERY DAY 84 tablet 3  . VITAMIN D PO Take by mouth. 20,000 units daily     No current facility-administered medications for this visit.    Allergies as of 04/08/2020 - Review Complete 04/08/2020  Allergen Reaction Noted  . Latex Rash 09/11/2013    Past Medical History:  Diagnosis Date  . Anxiety   . Contraceptive management 09/11/2013  .  Depression   . Deviated septum 06/2012  . Family history of colon cancer in mother 01/03/2015  . Frequent urination   . GERD (gastroesophageal reflux disease)   . Headache(784.0)    sinus  . IC (interstitial cystitis) 01/31/2015  . Nasal turbinate hypertrophy 06/2012   bilateral  . OAB (overactive bladder) 02/16/2013   Trial myrbetriq  . PONV (postoperative nausea and vomiting)   . Seasonal allergies   . Stomach pain 01/03/2015  . Unspecified symptom associated with female genital organs 02/14/2013  . Vaginal discharge 02/14/2013    Past Surgical History:  Procedure Laterality Date  . CESAREAN SECTION  10/20/2006  . CHOLECYSTECTOMY  07/14/2005   laparoscopic  . CYST EXCISION Left 08/14/2001   postauricular  . dilation and currettage    . NASAL SEPTOPLASTY W/ TURBINOPLASTY Bilateral 07/24/2012   Procedure: NASAL SEPTOPLASTY WITH BILATERAL TURBINATE RESECTION;  Surgeon: Ascencion Dike, MD;  Location: [facility];  Service: ENT;  Laterality: Bilateral;    Family History  Problem Relation Age of Onset  . Anesthesia problems Maternal Grandmother        hard to wake up post-op  . Cancer Maternal Grandmother        cervical  . Congestive Heart Failure Maternal Grandmother   . Cancer Mother  54       colon  . Diabetes Mother   . Parkinson's disease Father   . ADD / ADHD Daughter   . Diabetes Maternal Aunt   . Cancer Paternal Grandmother        breast  . Breast cancer Paternal Grandmother        ? age of onset  . Cancer Paternal Grandfather        lung    Social History   Socioeconomic History  . Marital status: Married    Spouse name: Not on file  . Number of children: Not on file  . Years of education: Not on file  . Highest education level: Not on file  Occupational History  . Not on file  Tobacco Use  . Smoking status: Current Every Day Smoker    Types: E-cigarettes    Last attempt to quit: 11/26/2010    Years since quitting: 9.3  . Smokeless tobacco: Never  Used  . Tobacco comment: vapes  Vaping Use  . Vaping Use: Every day  Substance and Sexual Activity  . Alcohol use: Yes    Comment: occ  . Drug use: No  . Sexual activity: Yes    Birth control/protection: Pill  Other Topics Concern  . Not on file  Social History Narrative  . Not on file   Social Determinants of Health   Financial Resource Strain: Not on file  Food Insecurity: Not on file  Transportation Needs: Not on file  Physical Activity: Not on file  Stress: Not on file  Social Connections: Not on file  Intimate Partner Violence: Not on file      ROS:  General: Negative for anorexia, weight loss, fever, chills, fatigue, weakness. Eyes: Negative for vision changes.  ENT: Negative for hoarseness, difficulty swallowing , nasal congestion. CV: Negative for chest pain, angina, palpitations, dyspnea on exertion, peripheral edema.  Respiratory: Negative for dyspnea at rest, dyspnea on exertion, cough, sputum, wheezing.  GI: See history of present illness. GU:  Negative for dysuria, hematuria, urinary incontinence, urinary frequency, nocturnal urination.  MS: Negative for joint pain, low back pain.  Derm: Negative for rash or itching.  Neuro: Negative for weakness, abnormal sensation, seizure, frequent headaches, memory loss, confusion.  Psych: Negative for anxiety, depression, suicidal ideation, hallucinations.  Endo: Negative for unusual weight change.  Heme: Negative for bruising or bleeding. Allergy: Negative for rash or hives.    Physical Examination:  BP 132/81   Pulse 90   Temp (!) 97.1 F (36.2 C)   Ht 5\' 5"  (1.651 m)   Wt 238 lb 9.6 oz (108.2 kg)   LMP 12/26/2019   BMI 39.71 kg/m    General: Well-nourished, well-developed in no acute distress.  Head: Normocephalic, atraumatic.   Eyes: Conjunctiva pink, no icterus. Mouth: masked Neck: Supple without thyromegaly, masses, or lymphadenopathy.  Lungs: Clear to auscultation bilaterally.  Heart: Regular  rate and rhythm, no murmurs rubs or gallops.  Abdomen: Bowel sounds are normal, nontender, nondistended, no hepatosplenomegaly or masses, no abdominal bruits or    hernia , no rebound or guarding.   Rectal: not performed Extremities: No lower extremity edema. No clubbing or deformities.  Neuro: Alert and oriented x 4 , grossly normal neurologically.  Skin: Warm and dry, no rash or jaundice.   Psych: Alert and cooperative, normal mood and affect.    Assessment/plan: Pleasant 45 year old female presenting at the request of Dr. Elonda Husky, for high risk screening colonoscopy.  Mother diagnosed with colon  cancer at age 17. Patient denies GI symptoms other than 2 decade long history of chronic constipation.   1. Colonoscopy with Dr. Abbey Chatters.  ASA III.  I have discussed the risks, alternatives, benefits with regards to but not limited to the risk of reaction to medication, bleeding, infection, perforation and the patient is agreeable to proceed. Written consent to be obtained. 2. Initiate Linzess 23mcg daily or every other day at least one week before her colonoscopy to manage constipation prior to bowel prep. Patient does not necessarily want to take daily for chronic constipation management.

## 2020-04-09 NOTE — Telephone Encounter (Signed)
Bowel prep coverage issues  Sent to [facility] clinical

## 2020-04-10 NOTE — Telephone Encounter (Signed)
If we don't have a sample, then offer trilyte if she does not want to pay full price of uncovered prep.   She would not be a good candidate for miralax prep.

## 2020-04-10 NOTE — Telephone Encounter (Signed)
Magda Paganini, pharmacy's suggested alternative is Trilyte. However, you wrote "avoid Trilyte" on encounter form. Please advise.

## 2020-04-14 ENCOUNTER — Other Ambulatory Visit: Payer: Self-pay | Admitting: Adult Health

## 2020-04-14 ENCOUNTER — Other Ambulatory Visit: Payer: Self-pay

## 2020-04-14 MED ORDER — PEG 3350-KCL-NA BICARB-NACL 420 G PO SOLR: 4000.0000 mL | ORAL | 0 refills | Status: DC

## 2020-04-14 NOTE — Telephone Encounter (Signed)
Tried to call pt, LMOVM for return call. 

## 2020-04-14 NOTE — Telephone Encounter (Signed)
Called pt, she will do Trilyte. Rx sent to pharmacy. Instructions mailed.

## 2020-04-15 ENCOUNTER — Other Ambulatory Visit: Payer: Self-pay

## 2020-04-15 MED ORDER — PEG 3350-KCL-NA BICARB-NACL 420 G PO SOLR
4000.0000 mL | ORAL | 0 refills | Status: DC
Start: 2020-04-15 — End: 2020-04-23

## 2020-04-15 NOTE — Progress Notes (Signed)
Resent rx

## 2020-04-21 ENCOUNTER — Encounter: Payer: Self-pay | Admitting: Obstetrics & Gynecology

## 2020-04-21 ENCOUNTER — Ambulatory Visit (INDEPENDENT_AMBULATORY_CARE_PROVIDER_SITE_OTHER): Payer: BC Managed Care – PPO | Admitting: Obstetrics & Gynecology

## 2020-04-21 ENCOUNTER — Other Ambulatory Visit: Payer: Self-pay

## 2020-04-21 VITALS — BP 123/82 | HR 84 | Ht 65.0 in | Wt 236.0 lb

## 2020-04-21 DIAGNOSIS — N3010 Interstitial cystitis (chronic) without hematuria: Secondary | ICD-10-CM | POA: Diagnosis not present

## 2020-04-21 DIAGNOSIS — N3281 Overactive bladder: Secondary | ICD-10-CM

## 2020-04-21 NOTE — Progress Notes (Signed)
Diagnosed with IC: 4 years ago  Current Meds:  DMSO, elmiron Dietary restrictions    Pt states her symptoms have been stable, no exacerbations, although not perfect Wants to continue on this cycle  Blood pressure 123/82, pulse 84, height 5\' 5"  (1.651 m), weight 236 lb (107 kg).    The external urethra meatus was prepped with betadine DMSO 50 cc was instilled in the usual fashion after the bladder was catheterized and emptied completely 50cc was instilled into the bladder without difficulty and the patient tolerated well She will refrain from voiding as long as possible  Follow up in 8 weeks, or as patient requests based on her symptom complex

## 2020-04-23 ENCOUNTER — Telehealth: Payer: Self-pay | Admitting: Internal Medicine

## 2020-04-23 NOTE — Telephone Encounter (Signed)
Pt called to say that she does not want the gallon jug prep and is requesting the prep with the 2 bottles and is half as much to drink. She said she is willing to pay out of pocket to have that. Please advise. [phone]

## 2020-04-24 ENCOUNTER — Other Ambulatory Visit: Payer: Self-pay

## 2020-04-24 ENCOUNTER — Other Ambulatory Visit: Payer: Self-pay | Admitting: Internal Medicine

## 2020-04-24 MED ORDER — SUPREP BOWEL PREP KIT 17.5-3.13-1.6 GM/177ML PO SOLN
1.0000 | ORAL | 0 refills | Status: DC
Start: 2020-04-24 — End: 2020-06-24

## 2020-04-24 NOTE — Telephone Encounter (Signed)
Resent Suprep rx to pharmacy. Suprep instructions sent to pt via MyChart.  Tried to call pt, LMOVM to inform her rx had been resent to pharmacy and instructions sent to [facility].

## 2020-04-28 NOTE — Telephone Encounter (Signed)
Received message that patient's prep isn't covered. Her procedure is on 4/8.   Spoke with Tretha Sciara. Patient opted to pay out of pocket for this.   Routing to [facility] Clinical Pool to ensure nothing else needs to be done.

## 2020-04-29 ENCOUNTER — Telehealth: Payer: Self-pay

## 2020-04-29 NOTE — Telephone Encounter (Signed)
Spoke to pt, informed her to arrive at 12:30pm for TCS 05/02/20.

## 2020-04-29 NOTE — Telephone Encounter (Signed)
Crystal at [facility] called office, they have been unable to reach pt to give her arrival time for TCS 05/02/20. Arrival time is 12:30pm.  Tried to call pt, LMOVM for return call.

## 2020-05-02 ENCOUNTER — Ambulatory Visit (HOSPITAL_COMMUNITY): Payer: BC Managed Care – PPO | Admitting: Anesthesiology

## 2020-05-02 ENCOUNTER — Encounter (HOSPITAL_COMMUNITY): Payer: Self-pay

## 2020-05-02 ENCOUNTER — Encounter (HOSPITAL_COMMUNITY)
Admission: RE | Disposition: A | Discharge status: Home / Self Care | Payer: Self-pay | Source: Home / Self Care | Attending: Internal Medicine

## 2020-05-02 ENCOUNTER — Ambulatory Visit (HOSPITAL_COMMUNITY)
Admission: RE | Admit: 2020-05-02 | Discharge: 2020-05-02 | Disposition: A | Discharge status: Home / Self Care | Payer: BC Managed Care – PPO | Attending: Internal Medicine | Admitting: Internal Medicine

## 2020-05-02 DIAGNOSIS — Z79899 Other long term (current) drug therapy: Secondary | ICD-10-CM | POA: Insufficient documentation

## 2020-05-02 DIAGNOSIS — Z91040 Latex allergy status: Secondary | ICD-10-CM | POA: Diagnosis not present

## 2020-05-02 DIAGNOSIS — K635 Polyp of colon: Secondary | ICD-10-CM

## 2020-05-02 DIAGNOSIS — Z1211 Encounter for screening for malignant neoplasm of colon: Secondary | ICD-10-CM | POA: Diagnosis not present

## 2020-05-02 DIAGNOSIS — F418 Other specified anxiety disorders: Secondary | ICD-10-CM | POA: Diagnosis not present

## 2020-05-02 DIAGNOSIS — D120 Benign neoplasm of cecum: Secondary | ICD-10-CM | POA: Insufficient documentation

## 2020-05-02 DIAGNOSIS — K648 Other hemorrhoids: Secondary | ICD-10-CM | POA: Diagnosis not present

## 2020-05-02 DIAGNOSIS — Z800 Family history of malignant neoplasm of digestive organs: Secondary | ICD-10-CM | POA: Insufficient documentation

## 2020-05-02 DIAGNOSIS — F17290 Nicotine dependence, other tobacco product, uncomplicated: Secondary | ICD-10-CM | POA: Insufficient documentation

## 2020-05-02 HISTORY — PX: POLYPECTOMY: SHX5525

## 2020-05-02 HISTORY — PX: COLONOSCOPY WITH PROPOFOL: SHX5780

## 2020-05-02 LAB — PREGNANCY, URINE: Preg Test, Ur: NEGATIVE

## 2020-05-02 SURGERY — COLONOSCOPY WITH PROPOFOL
Anesthesia: General

## 2020-05-02 MED ORDER — LACTATED RINGERS IV SOLN: INTRAVENOUS | Status: DC

## 2020-05-02 MED ORDER — PROPOFOL 500 MG/50ML IV EMUL
INTRAVENOUS | Status: DC | PRN
  Administered 2020-05-02: 150 ug/kg/min via INTRAVENOUS

## 2020-05-02 NOTE — Anesthesia Postprocedure Evaluation (Signed)
Anesthesia Post Note  Patient: Amanda Blackwell  Procedure(s) Performed: COLONOSCOPY WITH PROPOFOL (N/A ) POLYPECTOMY  Patient location during evaluation: Phase II Anesthesia Type: General Level of consciousness: awake, oriented, awake and alert and patient cooperative Pain management: satisfactory to patient Vital Signs Assessment: post-procedure vital signs reviewed and stable Respiratory status: spontaneous breathing, respiratory function stable and nonlabored ventilation Cardiovascular status: stable Postop Assessment: no apparent nausea or vomiting Anesthetic complications: no   No complications documented.   Last Vitals:  Vitals:   05/02/20 1345 05/02/20 1500  BP: 135/74 115/60  Pulse: 92 90  Resp: 12 14  Temp: 37.1 C 37 C  SpO2: 97% 100%    Last Pain:  Vitals:   05/02/20 1500  TempSrc: Oral  PainSc:                  [name]

## 2020-05-02 NOTE — Discharge Instructions (Signed)
  Colonoscopy Discharge Instructions  Read the instructions outlined below and refer to this sheet in the next few weeks. These discharge instructions provide you with general information on caring for yourself after you leave the hospital. Your doctor may also give you specific instructions. While your treatment has been planned according to the most current medical practices available, unavoidable complications occasionally occur.   ACTIVITY You may resume your regular activity, but move at a slower pace for the next 24 hours.  Take frequent rest periods for the next 24 hours.  Walking will help get rid of the air and reduce the bloated feeling in your belly (abdomen).  No driving for 24 hours (because of the medicine (anesthesia) used during the test).   Do not sign any important legal documents or operate any machinery for 24 hours (because of the anesthesia used during the test).  NUTRITION Drink plenty of fluids.  You may resume your normal diet as instructed by your doctor.  Begin with a light meal and progress to your normal diet. Heavy or fried foods are harder to digest and may make you feel sick to your stomach (nauseated).  Avoid alcoholic beverages for 24 hours or as instructed.  MEDICATIONS You may resume your normal medications unless your doctor tells you otherwise.  WHAT YOU CAN EXPECT TODAY Some feelings of bloating in the abdomen.  Passage of more gas than usual.  Spotting of blood in your stool or on the toilet paper.  IF YOU HAD POLYPS REMOVED DURING THE COLONOSCOPY: No aspirin products for 7 days or as instructed.  No alcohol for 7 days or as instructed.  Eat a soft diet for the next 24 hours.  FINDING OUT THE RESULTS OF YOUR TEST Not all test results are available during your visit. If your test results are not back during the visit, make an appointment with your caregiver to find out the results. Do not assume everything is normal if you have not heard from your  caregiver or the medical facility. It is important for you to follow up on all of your test results.  SEEK IMMEDIATE MEDICAL ATTENTION IF: You have more than a spotting of blood in your stool.  Your belly is swollen (abdominal distention).  You are nauseated or vomiting.  You have a temperature over 101.  You have abdominal pain or discomfort that is severe or gets worse throughout the day.   Your colonoscopy revealed 1 polyp(s) which I removed successfully. Await pathology results, my office will contact you. I recommend repeating colonoscopy in 5 years for surveillance purposes. Otherwise follow up with GI as needed.    I hope you have a great rest of your week!  Amanda Blackwell, D.O. Gastroenterology and Hepatology [facility]  

## 2020-05-02 NOTE — H&P (Signed)
Primary Care Physician:  Assunta Found, MD Primary Gastroenterologist:  Dr. Marletta Lor  Pre-Procedure History & Physical: HPI:  Amanda Blackwell is a 45 y.o. female is here for a colonoscopy to be performed for high risk colon cancer screening purposes due to family history of colon cancer in mother (age 63). No melena or hematochezia.  No abdominal pain or unintentional weight loss.  No change in bowel habits.  Overall feels well from a GI standpoint.  Past Medical History:  Diagnosis Date  . Anxiety   . Contraceptive management 09/11/2013  . Depression   . Deviated septum 06/2012  . Family history of colon cancer in mother 01/03/2015  . Frequent urination   . GERD (gastroesophageal reflux disease)   . Headache(784.0)    sinus  . IC (interstitial cystitis) 01/31/2015  . Nasal turbinate hypertrophy 06/2012   bilateral  . OAB (overactive bladder) 02/16/2013   Trial myrbetriq  . PONV (postoperative nausea and vomiting)   . Seasonal allergies   . Stomach pain 01/03/2015  . Unspecified symptom associated with female genital organs 02/14/2013  . Vaginal discharge 02/14/2013    Past Surgical History:  Procedure Laterality Date  . CESAREAN SECTION  10/20/2006  . CHOLECYSTECTOMY  07/14/2005   laparoscopic  . CYST EXCISION Left 08/14/2001   postauricular  . dilation and currettage    . NASAL SEPTOPLASTY W/ TURBINOPLASTY Bilateral 07/24/2012   Procedure: NASAL SEPTOPLASTY WITH BILATERAL TURBINATE RESECTION;  Surgeon: Darletta Moll, MD;  Location: [facility];  Service: ENT;  Laterality: Bilateral;    Prior to Admission medications   Medication Sig Start Date End Date Taking? Authorizing Provider  ALPRAZolam (XANAX) 0.5 MG tablet TAKE ONE TABLET BY MOUTH AT BEDTIME AS NEEDED. Patient taking differently: Take 0.25 mg by mouth at bedtime as needed for sleep. 03/24/20  Yes Lazaro Arms, MD  Ascorbic Acid (VITAMIN C) 500 MG CHEW Chew 500 mg by mouth at bedtime.   Yes [provider]   buPROPion (WELLBUTRIN XL) 150 MG 24 hr tablet TAKE 3 TABLETS(450 MG) BY MOUTH EVERY MORNING Patient taking differently: Take 450 mg by mouth daily. 04/14/20  Yes Cyril Mourning A, NP  cetirizine (ZYRTEC) 10 MG tablet Take 10 mg by mouth daily.   Yes [provider]  ELMIRON 100 MG capsule TAKE 2 CAPSULES BY MOUTH TWICE A DAY Patient taking differently: Take 200 mg by mouth daily. 05/01/19  Yes Adline Potter, NP  fluticasone (FLONASE) 50 MCG/ACT nasal spray Place 1 spray into both nostrils daily as needed for allergies or rhinitis.   Yes [provider]  ibuprofen (ADVIL) 200 MG tablet Take 400 mg by mouth every 6 (six) hours as needed for headache.   Yes [provider]  naproxen sodium (ALEVE) 220 MG tablet Take 440 mg by mouth daily as needed (back pain).   Yes [provider]  omeprazole (PRILOSEC OTC) 20 MG tablet Take 20 mg by mouth daily as needed (heartburn).   Yes [provider]  PROAIR HFA [phone] Base) MCG/ACT inhaler Inhale 2 puffs into the lungs every 4 (four) hours as needed for shortness of breath or wheezing. 04/04/17  Yes [provider]  Pseudoephedrine-Ibuprofen (ADVIL COLD/SINUS PO) Take 1 tablet by mouth daily as needed (sinus pressure).   Yes [provider]  rosuvastatin (CRESTOR) 10 MG tablet Take 10 mg by mouth every Monday, Wednesday, and Friday. 12/12/19  Yes [provider]  tolterodine (DETROL LA) 4 MG 24 hr  capsule TAKE 1 CAPSULE BY MOUTH EVERY DAY Patient taking differently: Take 4 mg by mouth daily. 02/25/20  Yes Derrek Monaco A, NP  VELIVET 0.1/0.125/0.15 -0.025 MG tablet TAKE 1 TABLET BY MOUTH EVERY DAY Patient taking differently: Take 1 tablet by mouth daily. 12/24/19  Yes Estill Dooms, NP  linaclotide (LINZESS) 290 MCG CAPS capsule Take daily or every other day starting one week before your colonoscopy. Patient taking differently: Take by mouth See admin instructions. Take 290  mg daily or every other day starting one week before your colonoscopy. 04/08/20   Mahala Menghini, PA-C  Na Sulfate-K Sulfate-Mg Sulf (SUPREP BOWEL PREP KIT) 17.5-3.13-1.6 GM/177ML SOLN Take 1 kit by mouth as directed. 04/24/20   Eloise Harman, DO  polyethylene glycol-electrolytes (TRILYTE) 420 g solution Take 4,000 mLs by mouth as directed. Patient not taking: No sig reported 04/14/20   Eloise Harman, DO    Allergies as of 04/08/2020 - Review Complete 04/08/2020  Allergen Reaction Noted  . Latex Rash 09/11/2013    Family History  Problem Relation Age of Onset  . Anesthesia problems Maternal Grandmother        hard to wake up post-op  . Cancer Maternal Grandmother        cervical  . Congestive Heart Failure Maternal Grandmother   . Cancer Mother 32       colon  . Diabetes Mother   . Parkinson's disease Father   . ADD / ADHD Daughter   . Diabetes Maternal Aunt   . Cancer Paternal Grandmother        breast  . Breast cancer Paternal Grandmother        ? age of onset  . Cancer Paternal Grandfather        lung    Social History   Socioeconomic History  . Marital status: Married    Spouse name: Not on file  . Number of children: Not on file  . Years of education: Not on file  . Highest education level: Not on file  Occupational History  . Not on file  Tobacco Use  . Smoking status: Current Every Day Smoker    Types: E-cigarettes    Last attempt to quit: 11/26/2010    Years since quitting: 9.4  . Smokeless tobacco: Never Used  . Tobacco comment: vapes  Vaping Use  . Vaping Use: Every day  Substance and Sexual Activity  . Alcohol use: Yes    Comment: occ  . Drug use: No  . Sexual activity: Yes    Birth control/protection: Pill  Other Topics Concern  . Not on file  Social History Narrative  . Not on file   Social Determinants of Health   Financial Resource Strain: Not on file  Food Insecurity: Not on file  Transportation Needs: Not on file  Physical  Activity: Not on file  Stress: Not on file  Social Connections: Not on file  Intimate Partner Violence: Not on file    Review of Systems: See HPI, otherwise negative ROS  Physical Exam: Vital signs in last 24 hours: Temp:  [98.7 F (37.1 C)] 98.7 F (37.1 C) (04/08 1345) Pulse Rate:  [92] 92 (04/08 1345) Resp:  [12] 12 (04/08 1345) BP: (135)/(74) 135/74 (04/08 1345) SpO2:  [97 %] 97 % (04/08 1345)   General:   Alert,  Well-developed, well-nourished, pleasant and cooperative in NAD Head:  Normocephalic and atraumatic. Eyes:  Sclera clear, no icterus.   Conjunctiva pink. Ears:  Normal  auditory acuity. Nose:  No deformity, discharge,  or lesions. Mouth:  No deformity or lesions, dentition normal. Neck:  Supple; no masses or thyromegaly. Lungs:  Clear throughout to auscultation.   No wheezes, crackles, or rhonchi. No acute distress. Heart:  Regular rate and rhythm; no murmurs, clicks, rubs,  or gallops. Abdomen:  Soft, nontender and nondistended. No masses, hepatosplenomegaly or hernias noted. Normal bowel sounds, without guarding, and without rebound.   Msk:  Symmetrical without gross deformities. Normal posture. Extremities:  Without clubbing or edema. Neurologic:  Alert and  oriented x4;  grossly normal neurologically. Skin:  Intact without significant lesions or rashes. Cervical Nodes:  No significant cervical adenopathy. Psych:  Alert and cooperative. Normal mood and affect.  Impression/Plan: Amanda Blackwell is here for a colonoscopy to be performed for high risk colon cancer screening purposes due to family history of colon cancer in mother (age 77).  The risks of the procedure including infection, bleed, or perforation as well as benefits, limitations, alternatives and imponderables have been reviewed with the patient. Questions have been answered. All parties agreeable.

## 2020-05-02 NOTE — Anesthesia Preprocedure Evaluation (Signed)
Anesthesia Evaluation  Patient identified by MRN, date of birth, ID band Patient awake    Reviewed: Allergy & Precautions, H&P , NPO status , Patient's Chart, lab work & pertinent test results, reviewed documented beta blocker date and time   History of Anesthesia Complications (+) PONV and history of anesthetic complications  Airway Mallampati: II  TM Distance: >3 FB Neck ROM: full    Dental no notable dental hx.    Pulmonary neg pulmonary ROS, Current Smoker and Patient abstained from smoking.,    Pulmonary exam normal breath sounds clear to auscultation       Cardiovascular Exercise Tolerance: Good negative cardio ROS   Rhythm:regular Rate:Normal     Neuro/Psych  Headaches, PSYCHIATRIC DISORDERS Anxiety Depression    GI/Hepatic Neg liver ROS, GERD  Medicated,  Endo/Other  negative endocrine ROS  Renal/GU negative Renal ROS  negative genitourinary   Musculoskeletal   Abdominal   Peds  Hematology negative hematology ROS (+)   Anesthesia Other Findings   Reproductive/Obstetrics negative OB ROS                             Anesthesia Physical Anesthesia Plan  ASA: II  Anesthesia Plan: General   Post-op Pain Management:    Induction:   PONV Risk Score and Plan: Propofol infusion  Airway Management Planned:   Additional Equipment:   Intra-op Plan:   Post-operative Plan:   Informed Consent: I have reviewed the patients History and Physical, chart, labs and discussed the procedure including the risks, benefits and alternatives for the proposed anesthesia with the patient or authorized representative who has indicated his/her understanding and acceptance.     Dental Advisory Given  Plan Discussed with: CRNA  Anesthesia Plan Comments:         Anesthesia Quick Evaluation

## 2020-05-02 NOTE — Op Note (Signed)
[facility] Patient Name: Amanda Blackwell Procedure Date: 05/02/2020 2:24 PM MRN: [id number] Date of Birth: 1975-07-22 Attending MD: Hennie Duos. Marletta Lor DO CSN: [id number] Age: 45 Admit Type: Outpatient Procedure:                Colonoscopy Indications:              Screening in patient at increased risk: Family                            history of 1st-degree relative with colorectal                            cancer before age 30 years Providers:                Hennie Duos. Marletta Lor, DO, [place] Scrape RN, RN,                            Judeth Cornfield. Jessee Avers, Technician Referring MD:              Medicines:                See the Anesthesia note for documentation of the                            administered medications Complications:            No immediate complications. Estimated Blood Loss:     Estimated blood loss was minimal. Procedure:                Pre-Anesthesia Assessment:                           - The anesthesia plan was to use monitored                            anesthesia care (MAC).                           After obtaining informed consent, the colonoscope                            was passed under direct vision. Throughout the                            procedure, the patient's blood pressure, pulse, and                            oxygen saturations were monitored continuously. The                            [id number]-[id number] ([id number]) scope was introduced through                            the anus and advanced to the the cecum, identified                            by appendiceal  orifice and ileocecal valve. The                            colonoscopy was performed without difficulty. The                            patient tolerated the procedure well. The quality                            of the bowel preparation was evaluated using the                            BBPS [employer] Bowel Preparation Scale) with scores                            of: Right Colon = 2  (minor amount of residual                            staining, small fragments of stool and/or opaque                            liquid, but mucosa seen well), Transverse Colon = 2                            (minor amount of residual staining, small fragments                            of stool and/or opaque liquid, but mucosa seen                            well) and Left Colon = 2 (minor amount of residual                            staining, small fragments of stool and/or opaque                            liquid, but mucosa seen well). The total BBPS score                            equals 6. The quality of the bowel preparation was                            fair. Scope In: 2:41:42 PM Scope Out: 2:55:56 PM Scope Withdrawal Time: 0 hours 9 minutes 23 seconds  Total Procedure Duration: 0 hours 14 minutes 14 seconds  Findings:      The perianal and digital rectal examinations were normal.      Non-bleeding internal hemorrhoids were found during endoscopy.      A 4 mm polyp was found in the cecum. The polyp was sessile. The polyp       was removed with a cold snare. Resection and retrieval were complete.      The exam was otherwise without abnormality. Impression:               -  Preparation of the colon was fair.                           - Non-bleeding internal hemorrhoids.                           - One 4 mm polyp in the cecum, removed with a cold                            snare. Resected and retrieved.                           - The examination was otherwise normal. Moderate Sedation:      Per Anesthesia Care Recommendation:           - Patient has a contact number available for                            emergencies. The signs and symptoms of potential                            delayed complications were discussed with the                            patient. Return to normal activities tomorrow.                            Written discharge instructions were provided to the                             patient.                           - Resume previous diet.                           - Continue present medications.                           - Await pathology results.                           - Repeat colonoscopy in 5 years for surveillance.                           - Return to GI clinic PRN. Procedure Code(s):        --- Professional ---                           [phone], Colonoscopy, flexible; with removal of                            tumor(s), polyp(s), or other lesion(s) by snare                            technique Diagnosis Code(s):        ---  Professional ---                           Z80.0, Family history of malignant neoplasm of                            digestive organs                           K64.8, Other hemorrhoids                           K63.5, Polyp of colon CPT copyright 2019 American Medical Association. All rights reserved. The codes documented in this report are preliminary and upon coder review may  be revised to meet current compliance requirements. Hennie Duos. Marletta Lor, DO Hennie Duos. Marletta Lor, DO 05/02/2020 3:03:35 PM This report has been signed electronically. Number of Addenda: 0

## 2020-05-02 NOTE — Progress Notes (Signed)
Husband notified pt's procedure has just started. Voiced understanding.

## 2020-05-02 NOTE — Transfer of Care (Signed)
Immediate Anesthesia Transfer of Care Note  Patient: Amanda Blackwell  Procedure(s) Performed: COLONOSCOPY WITH PROPOFOL (N/A ) POLYPECTOMY  Patient Location: PACU  Anesthesia Type:General  Level of Consciousness: awake, alert , oriented and patient cooperative  Airway & Oxygen Therapy: Patient Spontanous Breathing  Post-op Assessment: Report given to RN, Post -op Vital signs reviewed and stable and Patient moving all extremities X 4  Post vital signs: Reviewed and stable  Last Vitals:  Vitals Value Taken Time  BP 115/60 05/02/20 1500  Temp 37 C 05/02/20 1500  Pulse 90 05/02/20 1500  Resp 14 05/02/20 1500  SpO2 100 % 05/02/20 1500    Last Pain:  Vitals:   05/02/20 1500  TempSrc: Oral  PainSc:          Complications: No complications documented.

## 2020-05-05 NOTE — Progress Notes (Signed)
No answer after 2 attempts

## 2020-05-07 ENCOUNTER — Encounter (HOSPITAL_COMMUNITY): Payer: Self-pay | Admitting: Internal Medicine

## 2020-05-07 LAB — SURGICAL PATHOLOGY

## 2020-06-24 ENCOUNTER — Ambulatory Visit: Payer: BC Managed Care – PPO | Admitting: Obstetrics & Gynecology

## 2020-06-24 ENCOUNTER — Other Ambulatory Visit: Payer: Self-pay

## 2020-06-24 ENCOUNTER — Encounter: Payer: Self-pay | Admitting: Obstetrics & Gynecology

## 2020-06-24 VITALS — BP 124/80 | HR 101 | Ht 65.0 in | Wt 234.0 lb

## 2020-06-24 DIAGNOSIS — N3010 Interstitial cystitis (chronic) without hematuria: Secondary | ICD-10-CM | POA: Diagnosis not present

## 2020-06-24 DIAGNOSIS — N3281 Overactive bladder: Secondary | ICD-10-CM

## 2020-06-24 NOTE — Progress Notes (Signed)
Diagnosed with IC: 2018  Current Meds:  Elmiron, DMSO Dietary restrictions    Pt states her symptoms have been stable, no exacerbations, although not perfect Wants to continue on this cycle  Blood pressure 124/80, pulse (!) 101, height 5\' 5"  (1.651 m), weight 234 lb (106.1 kg), last menstrual period 06/20/2020.    The external urethra meatus was prepped with betadine DMSO 50 cc was instilled in the usual fashion after the bladder was catheterized and emptied completely 50cc was instilled into the bladder without difficulty and the patient tolerated well She will refrain from voiding as long as possible  Follow up in 8 weeks, or as patient requests based on her symptom complex

## 2020-06-27 ENCOUNTER — Other Ambulatory Visit: Payer: Self-pay | Admitting: Adult Health

## 2020-08-26 ENCOUNTER — Other Ambulatory Visit: Payer: Self-pay

## 2020-08-26 ENCOUNTER — Ambulatory Visit: Payer: BC Managed Care – PPO | Admitting: Obstetrics & Gynecology

## 2020-08-26 ENCOUNTER — Encounter: Payer: Self-pay | Admitting: Obstetrics & Gynecology

## 2020-08-26 VITALS — BP 133/84 | HR 86 | Ht 65.0 in | Wt 237.5 lb

## 2020-08-26 DIAGNOSIS — N3281 Overactive bladder: Secondary | ICD-10-CM

## 2020-08-26 DIAGNOSIS — N3010 Interstitial cystitis (chronic) without hematuria: Secondary | ICD-10-CM

## 2020-08-26 NOTE — Progress Notes (Signed)
Diagnosed with IC: 2018  Current Meds:  Elmiron Detrol LA DMSO Dietary restrictions    Pt states her symptoms have been stable, no exacerbations, although not perfect Wants to continue on this cycle  Blood pressure 133/84, pulse 86, height '5\' 5"'$  (1.651 m), weight 237 lb 8 oz (107.7 kg).    The external urethra meatus was prepped with betadine DMSO 50 cc was instilled in the usual fashion after the bladder was catheterized and emptied completely 50cc was instilled into the bladder without difficulty and the patient tolerated well She will refrain from voiding as long as possible  Follow up in 8 weeks, or as patient requests based on her symptom complex

## 2020-08-28 ENCOUNTER — Other Ambulatory Visit: Payer: Self-pay | Admitting: Adult Health

## 2020-09-09 DIAGNOSIS — J069 Acute upper respiratory infection, unspecified: Secondary | ICD-10-CM | POA: Diagnosis not present

## 2020-09-24 ENCOUNTER — Other Ambulatory Visit: Payer: Self-pay | Admitting: Obstetrics & Gynecology

## 2020-10-14 ENCOUNTER — Other Ambulatory Visit: Payer: Self-pay | Admitting: Adult Health

## 2020-10-27 ENCOUNTER — Other Ambulatory Visit: Payer: Self-pay | Admitting: Obstetrics & Gynecology

## 2020-10-27 DIAGNOSIS — Z1231 Encounter for screening mammogram for malignant neoplasm of breast: Secondary | ICD-10-CM

## 2020-10-28 ENCOUNTER — Ambulatory Visit: Payer: BC Managed Care – PPO | Admitting: Obstetrics & Gynecology

## 2020-10-30 ENCOUNTER — Ambulatory Visit: Payer: BC Managed Care – PPO | Admitting: Obstetrics & Gynecology

## 2020-11-05 ENCOUNTER — Other Ambulatory Visit: Payer: Self-pay | Admitting: Women's Health

## 2020-11-24 ENCOUNTER — Ambulatory Visit
Admission: EM | Admit: 2020-11-24 | Discharge: 2020-11-24 | Disposition: A | Discharge status: Home / Self Care | Payer: BC Managed Care – PPO | Attending: Family Medicine | Admitting: Family Medicine

## 2020-11-24 ENCOUNTER — Other Ambulatory Visit: Payer: Self-pay

## 2020-11-24 ENCOUNTER — Other Ambulatory Visit: Payer: Self-pay | Admitting: Family Medicine

## 2020-11-24 DIAGNOSIS — K30 Functional dyspepsia: Secondary | ICD-10-CM

## 2020-11-24 DIAGNOSIS — R079 Chest pain, unspecified: Secondary | ICD-10-CM

## 2020-11-24 DIAGNOSIS — F411 Generalized anxiety disorder: Secondary | ICD-10-CM | POA: Diagnosis not present

## 2020-11-24 DIAGNOSIS — R0789 Other chest pain: Secondary | ICD-10-CM | POA: Diagnosis not present

## 2020-11-24 MED ORDER — LIDOCAINE VISCOUS HCL 2 % MT SOLN
15.0000 mL | Freq: Once | OROMUCOSAL | Status: AC
  Administered 2020-11-24: 15 mL via ORAL

## 2020-11-24 MED ORDER — PANTOPRAZOLE SODIUM 40 MG PO TBEC: 40.0000 mg | DELAYED_RELEASE_TABLET | Freq: Every day | ORAL | 1 refills | Status: DC

## 2020-11-24 MED ORDER — SUCRALFATE 1 G PO TABS: 1.0000 g | ORAL_TABLET | Freq: Three times a day (TID) | ORAL | 1 refills | Status: DC | PRN

## 2020-11-24 MED ORDER — ALUM & MAG HYDROXIDE-SIMETH 200-200-20 MG/5ML PO SUSP
30.0000 mL | Freq: Once | ORAL | Status: AC
  Administered 2020-11-24: 30 mL via ORAL

## 2020-11-24 NOTE — Telephone Encounter (Signed)
   Notes to clinic: Pharmacy is requesting PA for prescription written today  Requested Prescriptions  Pending Prescriptions Disp Refills   pantoprazole (PROTONIX) 40 MG tablet [Pharmacy Med Name: PANTOPRAZOLE SOD DR 40 MG TAB] 30 tablet 1    Sig: TAKE 1 TABLET BY MOUTH EVERY DAY (NOT COVERED)     Gastroenterology: Proton Pump Inhibitors Failed - 11/24/2020  9:43 AM      Failed - Valid encounter within last 12 months    Recent Outpatient Visits   None               Requested Prescriptions  Pending Prescriptions Disp Refills   pantoprazole (PROTONIX) 40 MG tablet [Pharmacy Med Name: PANTOPRAZOLE SOD DR 40 MG TAB] 30 tablet 1    Sig: TAKE 1 TABLET BY MOUTH EVERY DAY (NOT COVERED)     Gastroenterology: Proton Pump Inhibitors Failed - 11/24/2020  9:43 AM      Failed - Valid encounter within last 12 months    Recent Outpatient Visits   None

## 2020-11-24 NOTE — ED Provider Notes (Signed)
[facility]    CSN: [id number] Arrival date & time: 11/24/20  0803      History   Chief Complaint No chief complaint on file.   HPI Amanda Blackwell is a 45 y.o. female.   Patient presenting today with sudden onset chest pain that started around midnight last night.  She states she was laying in bed when the pain started in her substernal chest region.  She rates it about a 5 out of 10 and states it has gotten a bit more mild since onset overnight.  She began to have some chest tightness and anxiousness once the pain began.  Also having some belching and bloating sensations associated.  Does have a history of indigestion for which she takes Prilosec daily, no history of cardiopulmonary issues that she is aware of.  Denies palpitations, dizziness, syncope, nausea, vomiting, abdominal pain, fever, chills, wheezing, recent illness.  So far has not tried anything over-the-counter for symptoms.    Past Medical History:  Diagnosis Date   Anxiety    Contraceptive management 09/11/2013   Depression    Deviated septum 06/2012   Family history of colon cancer in mother 01/03/2015   Frequent urination    GERD (gastroesophageal reflux disease)    Headache(784.0)    sinus   IC (interstitial cystitis) 01/31/2015   Nasal turbinate hypertrophy 06/2012   bilateral   OAB (overactive bladder) 02/16/2013   Trial myrbetriq   PONV (postoperative nausea and vomiting)    Seasonal allergies    Stomach pain 01/03/2015   Unspecified symptom associated with female genital organs 02/14/2013   Vaginal discharge 02/14/2013    Patient Active Problem List   Diagnosis Date Noted   Constipation 04/08/2020   Special screening for malignant neoplasms, colon 08/09/2017   Family hx of colon cancer 08/09/2017   IC (interstitial cystitis) 01/31/2015   Stomach pain 01/03/2015   Family history of colon cancer in mother 01/03/2015   Anxiety 09/11/2013   Contraceptive management 09/11/2013   OAB (overactive  bladder) 02/16/2013   Unspecified symptom associated with female genital organs 02/14/2013   Vaginal discharge 02/14/2013    Past Surgical History:  Procedure Laterality Date   CESAREAN SECTION  10/20/2006   CHOLECYSTECTOMY  07/14/2005   laparoscopic   COLONOSCOPY WITH PROPOFOL N/A 05/02/2020   Procedure: COLONOSCOPY WITH PROPOFOL;  Surgeon: Eloise Harman, DO;  Location: [facility];  Service: Endoscopy;  Laterality: N/A;  AM, pt tested + 02/29/20, results emailed to [facility] 08/14/2001   postauricular   dilation and currettage     NASAL SEPTOPLASTY W/ TURBINOPLASTY Bilateral 07/24/2012   Procedure: NASAL SEPTOPLASTY WITH BILATERAL TURBINATE RESECTION;  Surgeon: Ascencion Dike, MD;  Location: [facility];  Service: ENT;  Laterality: Bilateral;   POLYPECTOMY  05/02/2020   Procedure: POLYPECTOMY;  Surgeon: Eloise Harman, DO;  Location: [facility];  Service: Endoscopy;;  cecal polyp    OB History     Gravida  2   Para  1   Term  1   Preterm      AB  1   Living  1      SAB  1   IAB      Ectopic      Multiple      Live Births  1            Home Medications    Prior to Admission medications   Medication Sig Start  Date End Date Taking? Authorizing Provider  pantoprazole (PROTONIX) 40 MG tablet Take 1 tablet (40 mg total) by mouth daily. 11/24/20  Yes Volney American, PA-C  sucralfate (CARAFATE) 1 g tablet Take 1 tablet (1 g total) by mouth 3 (three) times daily as needed. Dissolve 1 tablet into a glass of water and drink as needed 11/24/20  Yes Volney American, PA-C  ALPRAZolam Duanne Moron) 0.5 MG tablet TAKE ONE TABLET BY MOUTH AT BEDTIME AS NEEDED. 09/24/20   Florian Buff, MD  Ascorbic Acid (VITAMIN C) 500 MG CHEW Chew 500 mg by mouth at bedtime.    [provider]  buPROPion (WELLBUTRIN XL) 150 MG 24 hr tablet TAKE 3 TABLETS(450 MG) BY MOUTH EVERY MORNING Patient taking differently: Take 450 mg by mouth  daily. 04/14/20   Estill Dooms, NP  cetirizine (ZYRTEC) 10 MG tablet Take 10 mg by mouth daily.    [provider]  ELMIRON 100 MG capsule TAKE 2 CAPSULES BY MOUTH TWICE A DAY 06/27/20   Estill Dooms, NP  fluticasone (FLONASE) 50 MCG/ACT nasal spray Place 1 spray into both nostrils daily as needed for allergies or rhinitis.    [provider]  naproxen sodium (ALEVE) 220 MG tablet Take 440 mg by mouth daily as needed (back pain).    [provider]  omeprazole (PRILOSEC OTC) 20 MG tablet Take 20 mg by mouth daily as needed (heartburn).    [provider]  PROAIR HFA 108 [phone] Base) MCG/ACT inhaler Inhale 2 puffs into the lungs every 4 (four) hours as needed for shortness of breath or wheezing. 04/04/17   [provider]  Pseudoephedrine-Ibuprofen (ADVIL COLD/SINUS PO) Take 1 tablet by mouth daily as needed (sinus pressure).    [provider]  rosuvastatin (CRESTOR) 10 MG tablet Take 10 mg by mouth every Monday, Wednesday, and Friday. 12/12/19   [provider]  tolterodine (DETROL LA) 4 MG 24 hr capsule TAKE 1 CAPSULE BY MOUTH EVERY DAY 08/29/20   Roma Schanz, CNM  VELIVET 0.1/0.125/0.15 -0.025 MG tablet TAKE 1 TABLET BY MOUTH EVERY DAY 10/14/20   Estill Dooms, NP    Family History Family History  Problem Relation Age of Onset   Anesthesia problems Maternal Grandmother        hard to wake up post-op   Cancer Maternal Grandmother        cervical   Congestive Heart Failure Maternal Grandmother    Cancer Mother 58       colon   Diabetes Mother    Parkinson's disease Father    ADD / ADHD Daughter    Diabetes Maternal Aunt    Cancer Paternal Grandmother        breast   Breast cancer Paternal Grandmother        ? age of onset   Cancer Paternal Grandfather        lung    Social History Social History   Tobacco Use   Smoking status: Every Day    Types: E-cigarettes    Last attempt to quit: 11/26/2010     Years since quitting: 10.0   Smokeless tobacco: Never   Tobacco comments:    vapes  Vaping Use   Vaping Use: Every day  Substance Use Topics   Alcohol use: Yes    Comment: occ   Drug use: No     Allergies   Latex   Review of Systems Review of Systems Per HPI  Physical Exam Triage Vital Signs ED Triage Vitals  Enc Vitals Group     BP 11/24/20 0810 137/87     Pulse Rate 11/24/20 0810 84     Resp 11/24/20 0810 20     Temp 11/24/20 0810 98.7 F (37.1 C)     Temp src --      SpO2 11/24/20 0810 98 %     Weight --      Height --      Head Circumference --      Peak Flow --      Pain Score 11/24/20 0817 5     Pain Loc --      Pain Edu? --      Excl. in [place]? --    No data found.  Updated Vital Signs BP 137/87   Pulse 84   Temp 98.7 F (37.1 C)   Resp 20   SpO2 98%   Visual Acuity Right Eye Distance:   Left Eye Distance:   Bilateral Distance:    Right Eye Near:   Left Eye Near:    Bilateral Near:     Physical Exam Vitals and nursing note reviewed.  Constitutional:      Appearance: Normal appearance. She is not ill-appearing.  HENT:     Head: Atraumatic.     Mouth/Throat:     Mouth: Mucous membranes are moist.  Eyes:     Extraocular Movements: Extraocular movements intact.     Conjunctiva/sclera: Conjunctivae normal.  Cardiovascular:     Rate and Rhythm: Normal rate and regular rhythm.     Heart sounds: Normal heart sounds.  Pulmonary:     Effort: Pulmonary effort is normal.     Breath sounds: Normal breath sounds. No wheezing or rales.  Abdominal:     General: Bowel sounds are normal. There is no distension.     Palpations: Abdomen is soft.     Tenderness: There is no abdominal tenderness. There is no guarding.  Musculoskeletal:        General: Normal range of motion.     Cervical back: Normal range of motion and neck supple.  Skin:    General: Skin is warm and dry.     Findings: No erythema or rash.  Neurological:     Mental Status: She  is alert and oriented to person, place, and time.     Motor: No weakness.     Gait: Gait normal.  Psychiatric:        Mood and Affect: Mood normal.        Thought Content: Thought content normal.        Judgment: Judgment normal.     UC Treatments / Results  Labs (all labs ordered are listed, but only abnormal results are displayed) Labs Reviewed - No data to display  EKG   Radiology No results found.  Procedures Procedures (including critical care time)  Medications Ordered in UC Medications  alum & mag hydroxide-simeth (MAALOX/MYLANTA) 200-200-20 MG/5ML suspension 30 mL (30 mLs Oral Given 11/24/20 0842)    And  lidocaine (XYLOCAINE) 2 % viscous mouth solution 15 mL (15 mLs Oral Given 11/24/20 0842)    Initial Impression / Assessment and Plan / UC Course  I have reviewed the triage vital signs and the nursing notes.  Pertinent labs & imaging results that were available during my care of the patient were reviewed by me and considered in my medical decision making (see chart for details).  Vital signs and exam very reassuring and she appears in no acute distress today.  No significant abnormalities noted on exam today.  Her EKG shows normal sinus rhythm at 79 bpm without acute ST or T wave changes.  She does have a history of indigestion, so GI cocktail was attempted in clinic with moderate noticeable improvement in her symptoms.  Suspect GERD and anxiety exacerbation to be causing her current symptoms but did discuss with her the limitations of further cardiac work-up in the setting and that if her symptoms did not significantly improve or worsen at any time she would need to go to the emergency department for further evaluation and rule out.  Very low suspicion for cardiopulmonary issue at this time, but did discuss that we cannot rule that out.  Protonix, Carafate sent for further management in case reflux issue.  Follow-up with PCP in the next few days for a recheck.  Work  note given.  Final Clinical Impressions(s) / UC Diagnoses   Final diagnoses:  Chest pain, unspecified type  Indigestion  Chest tightness  Anxiety state   Discharge Instructions   None    ED Prescriptions     Medication Sig Dispense Auth. Provider   sucralfate (CARAFATE) 1 g tablet Take 1 tablet (1 g total) by mouth 3 (three) times daily as needed. Dissolve 1 tablet into a glass of water and drink as needed 60 tablet Volney American, PA-C   pantoprazole (PROTONIX) 40 MG tablet Take 1 tablet (40 mg total) by mouth daily. 30 tablet Volney American, [place]      PDMP not reviewed this encounter.   Volney American, [place] 11/24/20 [phone]

## 2020-11-24 NOTE — ED Triage Notes (Signed)
Pt presents with c/o chest pain that began at 4 am this morning with sob, current 5/10 pain

## 2020-11-25 ENCOUNTER — Ambulatory Visit (INDEPENDENT_AMBULATORY_CARE_PROVIDER_SITE_OTHER): Payer: BC Managed Care – PPO | Admitting: Obstetrics & Gynecology

## 2020-11-25 ENCOUNTER — Encounter: Payer: Self-pay | Admitting: Obstetrics & Gynecology

## 2020-11-25 VITALS — BP 130/87 | HR 99 | Ht 65.0 in | Wt 234.5 lb

## 2020-11-25 DIAGNOSIS — N3010 Interstitial cystitis (chronic) without hematuria: Secondary | ICD-10-CM | POA: Diagnosis not present

## 2020-11-25 DIAGNOSIS — N3281 Overactive bladder: Secondary | ICD-10-CM | POA: Diagnosis not present

## 2020-11-25 MED ORDER — FLUCONAZOLE 100 MG PO TABS: 100.0000 mg | ORAL_TABLET | Freq: Every day | ORAL | 0 refills | Status: DC

## 2020-11-25 MED ORDER — ESTROGENS CONJUGATED 1.25 MG PO TABS: 1.2500 mg | ORAL_TABLET | Freq: Every day | ORAL | 11 refills | Status: DC

## 2020-11-25 NOTE — Progress Notes (Signed)
Diagnosed with IC: 2018  Current Meds:  Elmiron, DMSO Dietary restrictions    Pt states her symptoms have been stable, no exacerbations, although not perfect Wants to continue on this cycle  Blood pressure 130/87, pulse 99, height 5\' 5"  (1.651 m), weight 234 lb 8 oz (106.4 kg).    The external urethra meatus was prepped with betadine DMSO 50 cc was instilled in the usual fashion after the bladder was catheterized and emptied completely 50cc was instilled into the bladder without difficulty and the patient tolerated well She will refrain from voiding as long as possible  Follow up in 8 weeks, or as patient requests based on her symptom complex   Begin premarin 1.25 mg for vasomotor symptoms

## 2020-11-25 NOTE — Telephone Encounter (Signed)
Protonix switched to nexium

## 2020-11-26 ENCOUNTER — Other Ambulatory Visit: Payer: Self-pay

## 2020-11-26 ENCOUNTER — Ambulatory Visit
Admission: RE | Admit: 2020-11-26 | Discharge: 2020-11-26 | Disposition: A | Discharge status: Home / Self Care | Payer: BC Managed Care – PPO | Source: Ambulatory Visit | Attending: Obstetrics & Gynecology | Admitting: Obstetrics & Gynecology

## 2020-11-26 DIAGNOSIS — Z1231 Encounter for screening mammogram for malignant neoplasm of breast: Secondary | ICD-10-CM

## 2020-12-07 ENCOUNTER — Other Ambulatory Visit: Payer: Self-pay | Admitting: Adult Health

## 2020-12-15 DIAGNOSIS — G43909 Migraine, unspecified, not intractable, without status migrainosus: Secondary | ICD-10-CM | POA: Diagnosis not present

## 2020-12-15 DIAGNOSIS — Z6839 Body mass index (BMI) 39.0-39.9, adult: Secondary | ICD-10-CM | POA: Diagnosis not present

## 2020-12-15 DIAGNOSIS — E559 Vitamin D deficiency, unspecified: Secondary | ICD-10-CM | POA: Diagnosis not present

## 2020-12-15 DIAGNOSIS — Z1331 Encounter for screening for depression: Secondary | ICD-10-CM | POA: Diagnosis not present

## 2020-12-15 DIAGNOSIS — E785 Hyperlipidemia, unspecified: Secondary | ICD-10-CM | POA: Diagnosis not present

## 2020-12-15 DIAGNOSIS — R7309 Other abnormal glucose: Secondary | ICD-10-CM | POA: Diagnosis not present

## 2020-12-15 DIAGNOSIS — F419 Anxiety disorder, unspecified: Secondary | ICD-10-CM | POA: Diagnosis not present

## 2020-12-15 DIAGNOSIS — Z0000 Encounter for general adult medical examination without abnormal findings: Secondary | ICD-10-CM | POA: Diagnosis not present

## 2020-12-15 DIAGNOSIS — J019 Acute sinusitis, unspecified: Secondary | ICD-10-CM | POA: Diagnosis not present

## 2020-12-17 ENCOUNTER — Other Ambulatory Visit: Payer: Self-pay | Admitting: Women's Health

## 2021-02-24 ENCOUNTER — Encounter: Payer: Self-pay | Admitting: Obstetrics & Gynecology

## 2021-02-24 ENCOUNTER — Ambulatory Visit (INDEPENDENT_AMBULATORY_CARE_PROVIDER_SITE_OTHER): Payer: 59 | Admitting: Obstetrics & Gynecology

## 2021-02-24 ENCOUNTER — Other Ambulatory Visit: Payer: Self-pay

## 2021-02-24 VITALS — BP 127/78 | HR 99 | Ht 65.0 in | Wt 233.0 lb

## 2021-02-24 DIAGNOSIS — N3010 Interstitial cystitis (chronic) without hematuria: Secondary | ICD-10-CM | POA: Diagnosis not present

## 2021-02-24 DIAGNOSIS — Z1211 Encounter for screening for malignant neoplasm of colon: Secondary | ICD-10-CM

## 2021-02-24 NOTE — Progress Notes (Signed)
Diagnosed with IC: 2018  Current Meds:  Elmiron 'detrol LA DMSO Dietary restrictions    Pt states her symptoms have been stable, no exacerbations, although not perfect Wants to continue on this cycle  Blood pressure 127/78, pulse 99, height 5\' 5"  (1.651 m), weight 233 lb (105.7 kg).    The external urethra meatus was prepped with betadine DMSO 50 cc was instilled in the usual fashion after the bladder was catheterized and emptied completely 50cc was instilled into the bladder without difficulty and the patient tolerated well She will refrain from voiding as long as possible  Follow up in 8 weeks, or as patient requests based on her symptom complex

## 2021-03-03 ENCOUNTER — Telehealth: Payer: Self-pay | Admitting: *Deleted

## 2021-03-03 NOTE — Telephone Encounter (Signed)
Pt's insurance has approved Elmiron from 03/02/21-03/02/22. Pt and [facility] in [place] aware. [place]

## 2021-03-25 ENCOUNTER — Other Ambulatory Visit: Payer: Self-pay | Admitting: Obstetrics & Gynecology

## 2021-04-02 ENCOUNTER — Telehealth: Payer: Self-pay | Admitting: *Deleted

## 2021-04-02 ENCOUNTER — Other Ambulatory Visit: Payer: Self-pay | Admitting: Obstetrics & Gynecology

## 2021-04-02 MED ORDER — ALPRAZOLAM 0.5 MG PO TABS: 0.5000 mg | ORAL_TABLET | Freq: Every evening | ORAL | 5 refills | Status: DC | PRN

## 2021-04-02 NOTE — Telephone Encounter (Signed)
Pt is requesting a refill on Xanax. Please advise. Thanks! JSY 

## 2021-04-28 ENCOUNTER — Ambulatory Visit: Payer: 59 | Admitting: Obstetrics & Gynecology

## 2021-05-19 ENCOUNTER — Encounter: Payer: Self-pay | Admitting: Obstetrics & Gynecology

## 2021-05-19 ENCOUNTER — Ambulatory Visit (INDEPENDENT_AMBULATORY_CARE_PROVIDER_SITE_OTHER): Payer: 59 | Admitting: Obstetrics & Gynecology

## 2021-05-19 VITALS — BP 133/90 | HR 87 | Ht 65.0 in | Wt 234.0 lb

## 2021-05-19 DIAGNOSIS — B3731 Acute candidiasis of vulva and vagina: Secondary | ICD-10-CM

## 2021-05-19 DIAGNOSIS — N3010 Interstitial cystitis (chronic) without hematuria: Secondary | ICD-10-CM

## 2021-05-19 MED ORDER — FLUCONAZOLE 150 MG PO TABS: ORAL_TABLET | ORAL | 1 refills | Status: DC

## 2021-05-19 MED ORDER — GERHARDT'S BUTT CREAM: 1.0000 "application " | TOPICAL_CREAM | Freq: Two times a day (BID) | CUTANEOUS | 11 refills | Status: DC

## 2021-05-19 NOTE — Progress Notes (Signed)
Diagnosed with IC: 2018 ? ?Current Meds: ? ?elmiron ?Dietary restrictions ? ? ? ?Pt states her symptoms have been stable, no exacerbations, although not perfect ?Wants to continue on this cycle ? ?Blood pressure 133/90, pulse 87, height '5\' 5"'$  (1.651 m), weight 234 lb (106.1 kg).  ? ? ?The external urethra meatus was prepped with betadine ?DMSO 50 cc was instilled in the usual fashion after the bladder was catheterized and emptied completely ?50cc was instilled into the bladder without difficulty and the patient tolerated well ?She will refrain from voiding as long as possible ? ?Follow up in 8 weeks, or as patient requests based on her symptom complex ? ?Meds ordered this encounter  ?Medications  ? Nystatin (GERHARDT'S BUTT CREAM) CREA  ?  Sig: Apply 1 application. topically 2 (two) times daily.  ?  Dispense:  1 each  ?  Refill:  11  ? fluconazole (DIFLUCAN) 150 MG tablet  ?  Sig: Take 1 tablet today and repeat in 3 days  ?  Dispense:  2 tablet  ?  Refill:  1  ? ? ?  ?

## 2021-06-28 ENCOUNTER — Other Ambulatory Visit: Payer: Self-pay | Admitting: Adult Health

## 2021-07-14 ENCOUNTER — Ambulatory Visit (INDEPENDENT_AMBULATORY_CARE_PROVIDER_SITE_OTHER): Payer: 59 | Admitting: Obstetrics & Gynecology

## 2021-07-14 ENCOUNTER — Encounter: Payer: Self-pay | Admitting: Obstetrics & Gynecology

## 2021-07-14 VITALS — BP 127/87 | HR 99 | Ht 65.0 in | Wt 231.0 lb

## 2021-07-14 DIAGNOSIS — N3281 Overactive bladder: Secondary | ICD-10-CM

## 2021-07-14 DIAGNOSIS — N3010 Interstitial cystitis (chronic) without hematuria: Secondary | ICD-10-CM

## 2021-07-14 NOTE — Progress Notes (Signed)
Diagnosed with IC: 2018  Current Meds:  elmiron Dietary restrictions DMSO    Pt states her symptoms have been stable, no exacerbations, although not perfect Wants to continue on this cycle  Blood pressure 127/87, pulse 99, height '5\' 5"'$  (1.651 m), weight 231 lb (104.8 kg).    The external urethra meatus was prepped with betadine DMSO 50 cc was instilled in the usual fashion after the bladder was catheterized and emptied completely 50cc was instilled into the bladder without difficulty and the patient tolerated well She will refrain from voiding as long as possible  Follow up in 8 weeks, or as patient requests based on her symptom complex

## 2021-08-30 ENCOUNTER — Other Ambulatory Visit: Payer: Self-pay | Admitting: Adult Health

## 2021-09-08 ENCOUNTER — Ambulatory Visit (INDEPENDENT_AMBULATORY_CARE_PROVIDER_SITE_OTHER): Payer: 59 | Admitting: Obstetrics & Gynecology

## 2021-09-08 ENCOUNTER — Encounter: Payer: Self-pay | Admitting: Obstetrics & Gynecology

## 2021-09-08 VITALS — BP 134/82 | HR 96 | Wt 235.0 lb

## 2021-09-08 DIAGNOSIS — N3010 Interstitial cystitis (chronic) without hematuria: Secondary | ICD-10-CM

## 2021-09-08 DIAGNOSIS — N3281 Overactive bladder: Secondary | ICD-10-CM

## 2021-09-08 NOTE — Progress Notes (Signed)
Diagnosed with IC: 3 years ago  Current Meds:  DMSO, elmiron Dietary restrictions    Pt states her symptoms have been stable, no exacerbations, although not perfect Wants to continue on this cycle  Blood pressure 134/82, pulse 96, weight 235 lb (106.6 kg).    The external urethra meatus was prepped with betadine DMSO 50 cc was instilled in the usual fashion after the bladder was catheterized and emptied completely 50cc was instilled into the bladder without difficulty and the patient tolerated well She will refrain from voiding as long as possible  Follow up in 8 weeks, or as patient requests based on her symptom complex

## 2021-09-12 ENCOUNTER — Other Ambulatory Visit: Payer: Self-pay | Admitting: Adult Health

## 2021-09-30 ENCOUNTER — Other Ambulatory Visit: Payer: Self-pay | Admitting: Obstetrics & Gynecology

## 2021-10-06 ENCOUNTER — Telehealth: Payer: Self-pay | Admitting: *Deleted

## 2021-10-06 NOTE — Telephone Encounter (Signed)
Pt is requesting a refill on Xanax. Please advise. Thanks! [place]

## 2021-10-07 MED ORDER — ALPRAZOLAM 0.5 MG PO TABS: 0.5000 mg | ORAL_TABLET | Freq: Every evening | ORAL | 5 refills | Status: DC | PRN

## 2021-10-30 ENCOUNTER — Other Ambulatory Visit: Payer: Self-pay | Admitting: Obstetrics & Gynecology

## 2021-10-30 DIAGNOSIS — Z1231 Encounter for screening mammogram for malignant neoplasm of breast: Secondary | ICD-10-CM

## 2021-11-16 ENCOUNTER — Ambulatory Visit: Payer: 59 | Admitting: Obstetrics & Gynecology

## 2021-11-27 ENCOUNTER — Ambulatory Visit
Admission: RE | Admit: 2021-11-27 | Discharge: 2021-11-27 | Disposition: A | Discharge status: Home / Self Care | Payer: 59 | Source: Ambulatory Visit | Attending: Obstetrics & Gynecology | Admitting: Obstetrics & Gynecology

## 2021-11-27 DIAGNOSIS — Z1231 Encounter for screening mammogram for malignant neoplasm of breast: Secondary | ICD-10-CM

## 2021-12-10 ENCOUNTER — Ambulatory Visit (INDEPENDENT_AMBULATORY_CARE_PROVIDER_SITE_OTHER): Payer: 59 | Admitting: Obstetrics & Gynecology

## 2021-12-10 ENCOUNTER — Other Ambulatory Visit (HOSPITAL_COMMUNITY)
Admission: RE | Admit: 2021-12-10 | Discharge: 2021-12-10 | Disposition: A | Discharge status: Home / Self Care | Payer: 59 | Source: Ambulatory Visit | Attending: Obstetrics & Gynecology | Admitting: Obstetrics & Gynecology

## 2021-12-10 ENCOUNTER — Encounter: Payer: Self-pay | Admitting: Obstetrics & Gynecology

## 2021-12-10 VITALS — BP 138/82 | HR 93 | Ht 64.75 in | Wt 230.0 lb

## 2021-12-10 DIAGNOSIS — N3010 Interstitial cystitis (chronic) without hematuria: Secondary | ICD-10-CM | POA: Diagnosis not present

## 2021-12-10 DIAGNOSIS — Z01419 Encounter for gynecological examination (general) (routine) without abnormal findings: Secondary | ICD-10-CM | POA: Insufficient documentation

## 2021-12-10 NOTE — Progress Notes (Signed)
Subjective:     Amanda Blackwell is a 46 y.o. female here for a routine exam.  Patient's last menstrual period was 12/06/2021. [id number] Birth Control Method:  COC Menstrual Calendar(currently): regular  Current complaints: none.   Current acute medical issues:  none   Recent Gynecologic History Patient's last menstrual period was 12/06/2021. Last Pap: 03/2018,  normal Last mammogram: 11/23,  normal  Past Medical History:  Diagnosis Date   Anxiety    Contraceptive management 09/11/2013   Depression    Deviated septum 06/2012   Family history of colon cancer in mother 01/03/2015   Frequent urination    GERD (gastroesophageal reflux disease)    Headache(784.0)    sinus   IC (interstitial cystitis) 01/31/2015   Nasal turbinate hypertrophy 06/2012   bilateral   OAB (overactive bladder) 02/16/2013   Trial myrbetriq   PONV (postoperative nausea and vomiting)    Seasonal allergies    Stomach pain 01/03/2015   Unspecified symptom associated with female genital organs 02/14/2013   Vaginal discharge 02/14/2013    Past Surgical History:  Procedure Laterality Date   CESAREAN SECTION  10/20/2006   CHOLECYSTECTOMY  07/14/2005   laparoscopic   COLONOSCOPY WITH PROPOFOL N/A 05/02/2020   Procedure: COLONOSCOPY WITH PROPOFOL;  Surgeon: Eloise Harman, DO;  Location: [facility];  Service: Endoscopy;  Laterality: N/A;  AM, pt tested + 02/29/20, results emailed to [facility] 08/14/2001   postauricular   dilation and currettage     NASAL SEPTOPLASTY W/ TURBINOPLASTY Bilateral 07/24/2012   Procedure: NASAL SEPTOPLASTY WITH BILATERAL TURBINATE RESECTION;  Surgeon: Ascencion Dike, MD;  Location: [facility];  Service: ENT;  Laterality: Bilateral;   POLYPECTOMY  05/02/2020   Procedure: POLYPECTOMY;  Surgeon: Eloise Harman, DO;  Location: [facility];  Service: Endoscopy;;  cecal polyp    OB History     Gravida  2   Para  1   Term  1   Preterm      AB  1   Living   1      SAB  1   IAB      Ectopic      Multiple      Live Births  1           Social History   Socioeconomic History   Marital status: Married    Spouse name: Not on file   Number of children: Not on file   Years of education: Not on file   Highest education level: Not on file  Occupational History   Not on file  Tobacco Use   Smoking status: Every Day    Types: E-cigarettes    Last attempt to quit: 11/26/2010    Years since quitting: 11.0   Smokeless tobacco: Never   Tobacco comments:    vapes  Vaping Use   Vaping Use: Every day  Substance and Sexual Activity   Alcohol use: Yes    Comment: occ   Drug use: No   Sexual activity: Yes    Birth control/protection: Pill  Other Topics Concern   Not on file  Social History Narrative   Not on file   Social Determinants of Health   Financial Resource Strain: Low Risk  (12/10/2021)   Overall Financial Resource Strain (CARDIA)    Difficulty of Paying Living Expenses: Not hard at all  Food Insecurity: No Food Insecurity (12/10/2021)   Hunger Vital Sign  Worried About Charity fundraiser in the Last Year: Never true    [place] in the Last Year: Never true  Transportation Needs: No Transportation Needs (12/10/2021)   PRAPARE - Hydrologist (Medical): No    Lack of Transportation (Non-Medical): No  Physical Activity: Insufficiently Active (12/10/2021)   Exercise Vital Sign    Days of Exercise per Week: 2 days    Minutes of Exercise per Session: 30 min  Stress: Stress Concern Present (12/10/2021)   [place]    Feeling of Stress : To some extent  Social Connections: Moderately Integrated (12/10/2021)   Social Connection and Isolation Panel [NHANES]    Frequency of Communication with Friends and Family: More than three times a week    Frequency of Social Gatherings with Friends and Family: Three times a week     Attends Religious Services: 1 to 4 times per year    Active Member of Clubs or Organizations: No    Attends Music therapist: Never    Marital Status: Married    Family History  Problem Relation Age of Onset   Anesthesia problems Maternal Grandmother        hard to wake up post-op   Cancer Maternal Grandmother        cervical   Congestive Heart Failure Maternal Grandmother    Cancer Mother 69       colon   Diabetes Mother    Parkinson's disease Father    ADD / ADHD Daughter    Diabetes Maternal Aunt    Cancer Paternal Grandmother        breast   Breast cancer Paternal Grandmother        ? age of onset   Cancer Paternal Grandfather        lung     Current Outpatient Medications:    ALPRAZolam (XANAX) 0.5 MG tablet, Take 1 tablet (0.5 mg total) by mouth at bedtime as needed., Disp: 30 tablet, Rfl: 5   Ascorbic Acid (VITAMIN C) 500 MG CHEW, Chew 500 mg by mouth at bedtime., Disp: , Rfl:    buPROPion (WELLBUTRIN XL) 150 MG 24 hr tablet, TAKE 3 TABLETS(450 MG) BY MOUTH EVERY MORNING, Disp: 270 tablet, Rfl: 2   cetirizine (ZYRTEC) 10 MG tablet, Take 10 mg by mouth daily., Disp: , Rfl:    ELMIRON 100 MG capsule, TAKE 2 CAPSULES BY MOUTH TWICE A DAY, Disp: 120 capsule, Rfl: 11   fluticasone (FLONASE) 50 MCG/ACT nasal spray, Place 1 spray into both nostrils daily as needed for allergies or rhinitis., Disp: , Rfl:    naproxen sodium (ALEVE) 220 MG tablet, Take 440 mg by mouth daily as needed (back pain)., Disp: , Rfl:    Nystatin (GERHARDT'S BUTT CREAM) CREA, Apply 1 application. topically 2 (two) times daily., Disp: 1 each, Rfl: 11   pantoprazole (PROTONIX) 40 MG tablet, Take 40 mg by mouth daily., Disp: , Rfl:    PROAIR HFA 108 (90 Base) MCG/ACT inhaler, Inhale 2 puffs into the lungs every 4 (four) hours as needed for shortness of breath or wheezing., Disp: , Rfl: 11   Pseudoephedrine-Ibuprofen (ADVIL COLD/SINUS PO), Take 1 tablet by mouth daily as needed (sinus  pressure)., Disp: , Rfl:    rosuvastatin (CRESTOR) 10 MG tablet, Take 10 mg by mouth every Monday, Wednesday, and Friday., Disp: , Rfl:    sucralfate (CARAFATE) 1 g tablet, Take 1  tablet (1 g total) by mouth 3 (three) times daily as needed. Dissolve 1 tablet into a glass of water and drink as needed, Disp: 60 tablet, Rfl: 1   tolterodine (DETROL LA) 4 MG 24 hr capsule, TAKE 1 CAPSULE BY MOUTH EVERY DAY, Disp: 90 capsule, Rfl: 0   VELIVET 0.1/0.125/0.15 -0.025 MG tablet, TAKE 1 TABLET BY MOUTH EVERY DAY, Disp: 84 tablet, Rfl: 0   VITAMIN D PO, Take 10,000 Units by mouth., Disp: , Rfl:   Review of Systems  Review of Systems  Constitutional: Negative for fever, chills, weight loss, malaise/fatigue and diaphoresis.  HENT: Negative for hearing loss, ear pain, nosebleeds, congestion, sore throat, neck pain, tinnitus and ear discharge.   Eyes: Negative for blurred vision, double vision, photophobia, pain, discharge and redness.  Respiratory: Negative for cough, hemoptysis, sputum production, shortness of breath, wheezing and stridor.   Cardiovascular: Negative for chest pain, palpitations, orthopnea, claudication, leg swelling and PND.  Gastrointestinal: negative for abdominal pain. Negative for heartburn, nausea, vomiting, diarrhea, constipation, blood in stool and melena.  Genitourinary: Negative for dysuria, urgency, frequency, hematuria and flank pain.  Musculoskeletal: Negative for myalgias, back pain, joint pain and falls.  Skin: Negative for itching and rash.  Neurological: Negative for dizziness, tingling, tremors, sensory change, speech change, focal weakness, seizures, loss of consciousness, weakness and headaches.  Endo/Heme/Allergies: Negative for environmental allergies and polydipsia. Does not bruise/bleed easily.  Psychiatric/Behavioral: Negative for depression, suicidal ideas, hallucinations, memory loss and substance abuse. The patient is not nervous/anxious and does not have insomnia.         Objective:  Blood pressure 138/82, pulse 93, height 5' 4.75" (1.645 m), weight 230 lb (104.3 kg), last menstrual period 12/06/2021.   Physical Exam  Vitals reviewed. Constitutional: She is oriented to person, place, and time. She appears well-developed and well-nourished.  HENT:  Head: Normocephalic and atraumatic.        Right Ear: External ear normal.  Left Ear: External ear normal.  Nose: Nose normal.  Mouth/Throat: Oropharynx is clear and moist.  Eyes: Conjunctivae and EOM are normal. Pupils are equal, round, and reactive to light. Right eye exhibits no discharge. Left eye exhibits no discharge. No scleral icterus.  Neck: Normal range of motion. Neck supple. No tracheal deviation present. No thyromegaly present.  Cardiovascular: Normal rate, regular rhythm, normal heart sounds and intact distal pulses.  Exam reveals no gallop and no friction rub.   No murmur heard. Respiratory: Effort normal and breath sounds normal. No respiratory distress. She has no wheezes. She has no rales. She exhibits no tenderness.  GI: Soft. Bowel sounds are normal. She exhibits no distension and no mass. There is no tenderness. There is no rebound and no guarding.  Genitourinary:  Breasts no masses skin changes or nipple changes bilaterally      Vulva is normal without lesions Vagina is pink moist without discharge Cervix normal in appearance and pap is done Uterus is normal size shape and contour Adnexa is negative with normal sized ovaries   Musculoskeletal: Normal range of motion. She exhibits no edema and no tenderness.  Neurological: She is alert and oriented to person, place, and time. She has normal reflexes. She displays normal reflexes. No cranial nerve deficit. She exhibits normal muscle tone. Coordination normal.  Skin: Skin is warm and dry. No rash noted. No erythema. No pallor.  Psychiatric: She has a normal mood and affect. Her behavior is normal. Judgment and thought content normal.  Medications Ordered at today's visit: No orders of the defined types were placed in this encounter.   Other orders placed at today's visit: No orders of the defined types were placed in this encounter.     Assessment:    Normal Gyn exam.   IC stable on DMSO elmiron Plan:    Contraception: OCP (estrogen/progesterone). Follow up in: 3 years. For yearly      Return in about 3 years (around 12/10/2024) for yearly, 8 weeks DMSO.  Diagnosed with IC: 6 years ago  Current Meds:  Elmiron + dmso Dietary restrictions    Pt states her symptoms have been stable, no exacerbations, although not perfect Wants to continue on this cycle  Blood pressure 138/82, pulse 93, height 5' 4.75" (1.645 m), weight 230 lb (104.3 kg), last menstrual period 12/06/2021.    The external urethra meatus was prepped with betadine DMSO 50 cc was instilled in the usual fashion after the bladder was catheterized and emptied completely 50cc was instilled into the bladder without difficulty and the patient tolerated well She will refrain from voiding as long as possible  Follow up in 8 weeks, or as patient requests based on her symptom complex

## 2021-12-14 LAB — CYTOLOGY - PAP
Comment: NEGATIVE
Diagnosis: NEGATIVE
High risk HPV: NEGATIVE

## 2021-12-27 ENCOUNTER — Other Ambulatory Visit: Payer: Self-pay | Admitting: Adult Health

## 2022-02-08 ENCOUNTER — Encounter: Payer: Self-pay | Admitting: Obstetrics & Gynecology

## 2022-02-08 ENCOUNTER — Ambulatory Visit (INDEPENDENT_AMBULATORY_CARE_PROVIDER_SITE_OTHER): Payer: 59 | Admitting: Obstetrics & Gynecology

## 2022-02-08 VITALS — BP 120/74 | HR 93 | Ht 64.0 in | Wt 236.0 lb

## 2022-02-08 DIAGNOSIS — N3010 Interstitial cystitis (chronic) without hematuria: Secondary | ICD-10-CM | POA: Diagnosis not present

## 2022-02-08 DIAGNOSIS — N3281 Overactive bladder: Secondary | ICD-10-CM

## 2022-02-08 NOTE — Progress Notes (Signed)
Diagnosed with IC: 2018  Current Meds:  Elmiron, DMSO Dietary restrictions    Pt states her symptoms have been stable, no exacerbations, although not perfect Wants to continue on this cycle  Blood pressure 120/74, pulse 93, height '5\' 4"'$  (1.626 m), weight 236 lb (107 kg).    The external urethra meatus was prepped with betadine DMSO 50 cc was instilled in the usual fashion after the bladder was catheterized and emptied completely 50cc was instilled into the bladder without difficulty and the patient tolerated well She will refrain from voiding as long as possible  Follow up in 8 weeks, or as patient requests based on her symptom complex

## 2022-04-05 ENCOUNTER — Ambulatory Visit: Payer: 59 | Admitting: Obstetrics & Gynecology

## 2022-04-08 ENCOUNTER — Encounter: Payer: Self-pay | Admitting: Obstetrics & Gynecology

## 2022-04-27 ENCOUNTER — Encounter: Payer: Self-pay | Admitting: Obstetrics & Gynecology

## 2022-04-27 ENCOUNTER — Ambulatory Visit (INDEPENDENT_AMBULATORY_CARE_PROVIDER_SITE_OTHER): Payer: 59 | Admitting: Obstetrics & Gynecology

## 2022-04-27 VITALS — BP 132/86 | HR 98 | Ht 64.0 in | Wt 236.0 lb

## 2022-04-27 DIAGNOSIS — N3010 Interstitial cystitis (chronic) without hematuria: Secondary | ICD-10-CM | POA: Diagnosis not present

## 2022-04-27 MED ORDER — ALPRAZOLAM 0.5 MG PO TABS: 0.5000 mg | ORAL_TABLET | Freq: Every evening | ORAL | 5 refills | Status: DC | PRN

## 2022-04-27 MED ORDER — ALBUTEROL SULFATE HFA 108 (90 BASE) MCG/ACT IN AERS: 2.0000 | INHALATION_SPRAY | Freq: Four times a day (QID) | RESPIRATORY_TRACT | 2 refills | Status: DC | PRN

## 2022-04-27 NOTE — Progress Notes (Signed)
Diagnosed with IC: 2018  Current Meds:  elmiron Dietary restrictions    Pt states her symptoms have been stable, no exacerbations, although not perfect Wants to continue on this cycle  Blood pressure 132/86, pulse 98, height 5\' 4"  (1.626 m), weight 236 lb (107 kg).    The external urethra meatus was prepped with betadine DMSO 50 cc was instilled in the usual fashion after the bladder was catheterized and emptied completely 50cc was instilled into the bladder without difficulty and the patient tolerated well She will refrain from voiding as long as possible  Follow up in 8 weeks, or as patient requests based on her symptom complex

## 2022-05-04 ENCOUNTER — Encounter: Payer: Self-pay | Admitting: *Deleted

## 2022-05-06 ENCOUNTER — Encounter: Payer: Self-pay | Admitting: *Deleted

## 2022-05-10 ENCOUNTER — Telehealth: Payer: Self-pay | Admitting: *Deleted

## 2022-05-10 NOTE — Telephone Encounter (Signed)
Pt's insurance has approved Elmiron. Approved from 05/07/22-05/07/23. Pt aware. Message left at [facility] in [place] of approval. JSY

## 2022-05-18 ENCOUNTER — Encounter: Payer: Self-pay | Admitting: *Deleted

## 2022-05-19 MED ORDER — PROAIR HFA 108 (90 BASE) MCG/ACT IN AERS: 2.0000 | INHALATION_SPRAY | RESPIRATORY_TRACT | 11 refills | Status: DC | PRN

## 2022-05-30 ENCOUNTER — Ambulatory Visit (INDEPENDENT_AMBULATORY_CARE_PROVIDER_SITE_OTHER): Payer: 59

## 2022-05-30 ENCOUNTER — Other Ambulatory Visit: Payer: Self-pay | Admitting: Adult Health

## 2022-05-30 ENCOUNTER — Ambulatory Visit
Admission: RE | Admit: 2022-05-30 | Discharge: 2022-05-30 | Disposition: A | Discharge status: Home / Self Care | Payer: 59 | Source: Ambulatory Visit | Attending: Physician Assistant | Admitting: Physician Assistant

## 2022-05-30 VITALS — BP 123/79 | HR 95 | Temp 98.3°F | Resp 16

## 2022-05-30 DIAGNOSIS — M25551 Pain in right hip: Secondary | ICD-10-CM

## 2022-05-30 MED ORDER — LIDOCAINE 5 % EX PTCH: 1.0000 | MEDICATED_PATCH | CUTANEOUS | 0 refills | Status: DC

## 2022-05-30 MED ORDER — METHOCARBAMOL 500 MG PO TABS: 500.0000 mg | ORAL_TABLET | Freq: Two times a day (BID) | ORAL | 0 refills | Status: DC

## 2022-05-30 MED ORDER — DICLOFENAC SODIUM 75 MG PO TBEC: 75.0000 mg | DELAYED_RELEASE_TABLET | Freq: Two times a day (BID) | ORAL | 0 refills | Status: DC

## 2022-05-30 NOTE — Discharge Instructions (Addendum)
Your x-ray was with no evidence of fracture or dislocation.  They were able to see some arthritis in your lower spine but did not examine this fully with the films to be obtained.  This could be contributing to your symptoms.  Start diclofenac for pain.  Do not take NSAIDs with this medication including aspirin, ibuprofen/Advil, naproxen/Aleve.  Take methocarbamol to twice a day.  This make you sleepy so do not drive or drink alcohol with taking it.  Use lidocaine patches during the day and remove these at night.  You should only use 1 patch per 24 hours.  If your symptoms or not improving please follow-up with orthopedics; call to schedule appointment.  If anything worsens and you have increasing pain, difficulty walking, numbness or tingling you should be seen immediately.

## 2022-05-30 NOTE — ED Triage Notes (Addendum)
Pt reports her right hip has been bothering her x 1 month. 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 Took aleve and a heating pad which gave some relief.   Pain increases when she sits down and tries to get up. Pt denies injury

## 2022-05-30 NOTE — ED Provider Notes (Signed)
[facility]    CSN: [id number] Arrival date & time: 05/30/22  0946      History   Chief Complaint Chief Complaint  Patient presents with   Hip Pain    Entered by patient    HPI Amanda Blackwell is a 47 y.o. female.   Patient presents today with a 1 month history of intermittent right hip pain.  She denies any known injury increase in activity prior to symptom onset but does have a physically demanding job that could have triggered pain.  She denies previous injury or surgery.  She reports that pain does tend to worsen after certain activities including working in her yard.  She reports that pain is currently rated 4/5 on a 0-10 pain scale, localized to lateral hip with radiation into buttocks/back, described as intense aching but worse with certain movements, no alleviating factors identified.  She has tried Tylenol ibuprofen but these were ineffective so she switched to heat and Aleve with temporary relief of symptoms.  Denies history of osteo or rheumatoid arthritis.  Denies any weakness, numbness, paresthesias in her foot.  She is having difficulty with heel activities as transition from sitting to standing is painful and there have been several times where she almost fell but denies any actual falls.    Past Medical History:  Diagnosis Date   Anxiety    Asthma    with seasonal changes   Contraceptive management 09/11/2013   Depression    Deviated septum 06/2012   Family history of colon cancer in mother 01/03/2015   Frequent urination    GERD (gastroesophageal reflux disease)    Headache(784.0)    sinus   IC (interstitial cystitis) 01/31/2015   Nasal turbinate hypertrophy 06/2012   bilateral   OAB (overactive bladder) 02/16/2013   Trial myrbetriq   PONV (postoperative nausea and vomiting)    Seasonal allergies    Stomach pain 01/03/2015   Unspecified symptom associated with female genital organs 02/14/2013   Vaginal discharge 02/14/2013    Patient Active  Problem List   Diagnosis Date Noted   Constipation 04/08/2020   Special screening for malignant neoplasms, colon 08/09/2017   Family hx of colon cancer 08/09/2017   IC (interstitial cystitis) 01/31/2015   Stomach pain 01/03/2015   Family history of colon cancer in mother 01/03/2015   Anxiety 09/11/2013   Contraceptive management 09/11/2013   OAB (overactive bladder) 02/16/2013   Unspecified symptom associated with female genital organs 02/14/2013   Vaginal discharge 02/14/2013    Past Surgical History:  Procedure Laterality Date   CESAREAN SECTION  10/20/2006   CHOLECYSTECTOMY  07/14/2005   laparoscopic   COLONOSCOPY WITH PROPOFOL N/A 05/02/2020   Procedure: COLONOSCOPY WITH PROPOFOL;  Surgeon: Lanelle Bal, DO;  [facility];  Service: Endoscopy;  Laterality: N/A;  AM, pt tested + 02/29/20, results emailed to [facility]   CYST EXCISION Left 08/14/2001   postauricular   dilation and currettage     NASAL SEPTOPLASTY W/ TURBINOPLASTY Bilateral 07/24/2012   Procedure: NASAL SEPTOPLASTY WITH BILATERAL TURBINATE RESECTION;  Surgeon: Darletta Moll, MD;  Location: [facility];  Service: ENT;  Laterality: Bilateral;   POLYPECTOMY  05/02/2020   Procedure: POLYPECTOMY;  Surgeon: Lanelle Bal, DO;  [facility]: [facility];  Service: Endoscopy;;  cecal polyp    OB History     Gravida  2   Para  1   Term  1   Preterm  AB  1   Living  1      SAB  1   IAB      Ectopic      Multiple      Live Births  1            Home Medications    Prior to Admission medications   Medication Sig Start Date End Date Taking? Authorizing Provider  diclofenac (VOLTAREN) 75 MG EC tablet Take 1 tablet (75 mg total) by mouth 2 (two) times daily. 05/30/22  Yes Ksean Vale K, PA-C  lidocaine (LIDODERM) 5 % Place 1 patch onto the skin daily. Remove & Discard patch within 12 hours or as directed by MD 05/30/22  Yes Muriel Wilber, Denny Peon K, PA-C  methocarbamol (ROBAXIN) 500 MG  tablet Take 1 tablet (500 mg total) by mouth 2 (two) times daily. 05/30/22  Yes Ajmal Kathan, Noberto Retort, PA-C  ALPRAZolam (XANAX) 0.5 MG tablet Take 1 tablet (0.5 mg total) by mouth at bedtime as needed. 04/27/22   Lazaro Arms, MD  Ascorbic Acid (VITAMIN C) 500 MG CHEW Chew 500 mg by mouth at bedtime.    [provider]  buPROPion (WELLBUTRIN XL) 150 MG 24 hr tablet TAKE 3 TABLETS(450 MG) BY MOUTH EVERY MORNING 09/14/21   Adline Potter, NP  cetirizine (ZYRTEC) 10 MG tablet Take 10 mg by mouth daily.    [provider]  desogestrel-ethinyl estradiol (VELIVET) 0.1/0.125/0.15 -0.025 MG tablet TAKE 1 TABLET BY MOUTH EVERY DAY 12/28/21   Adline Potter, NP  ELMIRON 100 MG capsule TAKE 2 CAPSULES BY MOUTH TWICE A DAY 06/29/21   Adline Potter, NP  fluticasone (FLONASE) 50 MCG/ACT nasal spray Place 1 spray into both nostrils daily as needed for allergies or rhinitis.    [provider]  Nystatin (GERHARDT'S BUTT CREAM) CREA Apply 1 application. topically 2 (two) times daily. 05/19/21   Lazaro Arms, MD  pantoprazole (PROTONIX) 40 MG tablet Take 40 mg by mouth daily. 05/15/21   [provider]  PROAIR HFA 108 (90 Base) MCG/ACT inhaler Inhale 2 puffs into the lungs every 4 (four) hours as needed for shortness of breath or wheezing. 05/19/22   Lazaro Arms, MD  Pseudoephedrine-Ibuprofen (ADVIL COLD/SINUS PO) Take 1 tablet by mouth daily as needed (sinus pressure).    [provider]  rosuvastatin (CRESTOR) 10 MG tablet Take 10 mg by mouth every Monday, Wednesday, and Friday. 12/12/19   [provider]  sucralfate (CARAFATE) 1 g tablet Take 1 tablet (1 g total) by mouth 3 (three) times daily as needed. Dissolve 1 tablet into a glass of water and drink as needed 11/24/20   Particia Nearing, PA-C  tolterodine (DETROL LA) 4 MG 24 hr capsule TAKE 1 CAPSULE BY MOUTH EVERY DAY 12/17/20   Cheral Marker, CNM  VITAMIN D PO Take 10,000 Units by mouth.     [provider]    Family History Family History  Problem Relation Age of Onset   Anesthesia problems Maternal Grandmother        hard to wake up post-op   Cancer Maternal Grandmother        cervical   Congestive Heart Failure Maternal Grandmother    Cancer Mother 22       colon   Diabetes Mother    Parkinson's disease Father    ADD / ADHD Daughter    Diabetes Maternal Aunt    Cancer Paternal Grandmother  breast   Breast cancer Paternal Grandmother        ? age of onset   Cancer Paternal Grandfather        lung    Social History Social History   Tobacco Use   Smoking status: Every Day    Types: E-cigarettes    Last attempt to quit: 11/26/2010    Years since quitting: 11.5   Smokeless tobacco: Never   Tobacco comments:    vapes  Vaping Use   Vaping Use: Every day  Substance Use Topics   Alcohol use: Yes    Comment: occ   Drug use: No     Allergies   Latex   Review of Systems Review of Systems  Constitutional:  Positive for activity change. Negative for appetite change, fatigue and fever.  Musculoskeletal:  Positive for arthralgias and gait problem. Negative for joint swelling and myalgias.  Neurological:  Negative for weakness and numbness.     Physical Exam Triage Vital Signs ED Triage Vitals  Enc Vitals Group     BP 05/30/22 0955 123/79     Pulse Rate 05/30/22 0955 95     Resp 05/30/22 0955 16     Temp 05/30/22 0955 98.3 F (36.8 C)     Temp Source 05/30/22 0955 Oral     SpO2 05/30/22 0955 96 %     Weight --      Height --      Head Circumference --      Peak Flow --      Pain Score 05/30/22 0953 5     Pain Loc --      Pain Edu? --      Excl. in GC? --    No data found.  Updated Vital Signs BP 123/79 (BP Location: Right Arm)   Pulse 95   Temp 98.3 F (36.8 C) (Oral)   Resp 16   LMP 05/05/2022 (Approximate)   SpO2 96%   Visual Acuity Right Eye Distance:   Left Eye Distance:   Bilateral Distance:    Right Eye  Near:   Left Eye Near:    Bilateral Near:     Physical Exam Vitals reviewed.  Constitutional:      General: She is awake. She is not in acute distress.    Appearance: Normal appearance. She is well-developed. She is not ill-appearing.     Comments: Very pleasant female appears stated age in no acute distress sitting comfortably in exam room  HENT:     Head: Normocephalic and atraumatic.  Cardiovascular:     Rate and Rhythm: Normal rate and regular rhythm.     Heart sounds: Normal heart sounds, S1 normal and S2 normal. No murmur heard. Pulmonary:     Effort: Pulmonary effort is normal.     Breath sounds: Normal breath sounds. No wheezing, rhonchi or rales.     Comments: Clear auscultation bilaterally Musculoskeletal:     Cervical back: No tenderness or bony tenderness.     Thoracic back: No tenderness or bony tenderness.     Lumbar back: Tenderness present. No bony tenderness. Negative right straight leg raise test and negative left straight leg raise test.     Right hip: Tenderness and bony tenderness present. No deformity or crepitus. Normal range of motion. Normal strength.     Comments: Back: No pain with percussion of vertebrae.  Mild tenderness palpation of right lumbar paraspinal muscles.  No spasm noted.  Normal active range of motion.  No deformity or step-off noted.  Right hip: Tenderness palpation over lateral hip into buttocks.  No crepitus or deformity noted.  Strength 5/5 bilateral lower extremities.  Psychiatric:        Behavior: Behavior is cooperative.      UC Treatments / Results  Labs (all labs ordered are listed, but only abnormal results are displayed) Labs Reviewed - No data to display  EKG   Radiology DG Hip Unilat With Pelvis 2-3 Views Right  Result Date: 05/30/2022 CLINICAL DATA:  Right hip pain over the last month EXAM: DG HIP (WITH OR WITHOUT PELVIS) 2-3V RIGHT COMPARISON:  None Available. FINDINGS: No evidence of regional fracture or focal bone  lesion. Sacroiliac joints appear unremarkable. There is ordinary lower lumbar degenerative change, not primarily evaluated. No evidence hip joint space narrowing. IMPRESSION: No acute or traumatic finding. Lower lumbar degenerative change, not primarily evaluated. Electronically Signed   By: Paulina Fusi M.D.   On: 05/30/2022 10:27    Procedures Procedures (including critical care time)  Medications Ordered in UC Medications - No data to display  Initial Impression / Assessment and Plan / UC Course  I have reviewed the triage vital signs and the nursing notes.  Pertinent labs & imaging results that were available during my care of the patient were reviewed by me and considered in my medical decision making (see chart for details).     Patient is well-appearing, afebrile, nontoxic, nontachycardic.  X-ray of hip was obtained that showed no acute osseous abnormality but did show degenerative changes in the lumbar spine that were visualized.  We discussed this could be contributing to symptoms.  Recommended she begin diclofenac for pain relief with instruction not to take additional NSAIDs.  Can use acetaminophen/Tylenol for additional symptom relief.  She was also prescribed lidocaine patches with instruction to keep them on for only 12 hours at a time and remove them for minimum of 12 hours using only 1 patch per 24 hours.  Will also start methocarbamol to help manage muscle pain we discussed that this can be sedating and she should not drive or drink alcohol with taking it.  If her symptoms or not improving and she is to follow-up with sports medicine and was given contact information for local her provider with instruction to call to schedule an appointment.  Discussed that if she has any worsening symptoms including increasing pain, difficulty ambulating, numbness or paresthesias she should be seen immediately.  Strict return precautions given.  Work excuse note provided.  Final Clinical  Impressions(s) / UC Diagnoses   Final diagnoses:  Right hip pain     Discharge Instructions      Your x-ray was with no evidence of fracture or dislocation.  They were able to see some arthritis in your lower spine but did not examine this fully with the films to be obtained.  This could be contributing to your symptoms.  Start diclofenac for pain.  Do not take NSAIDs with this medication including aspirin, ibuprofen/Advil, naproxen/Aleve.  Take methocarbamol to twice a day.  This make you sleepy so do not drive or drink alcohol with taking it.  Use lidocaine patches during the day and remove these at night.  You should only use 1 patch per 24 hours.  If your symptoms or not improving please follow-up with orthopedics; call to schedule appointment.  If anything worsens and you have increasing pain, difficulty walking, numbness or tingling you should be seen immediately.  ED Prescriptions     Medication Sig Dispense Auth. Provider   diclofenac (VOLTAREN) 75 MG EC tablet Take 1 tablet (75 mg total) by mouth 2 (two) times daily. 14 tablet Elih Mooney K, PA-C   lidocaine (LIDODERM) 5 % Place 1 patch onto the skin daily. Remove & Discard patch within 12 hours or as directed by MD 30 patch Germani Gavilanes K, PA-C   methocarbamol (ROBAXIN) 500 MG tablet Take 1 tablet (500 mg total) by mouth 2 (two) times daily. 20 tablet Kimanh Templeman, Noberto Retort, PA-C      PDMP not reviewed this encounter.   Jeani Hawking, PA-C 05/30/22 1036

## 2022-06-09 ENCOUNTER — Other Ambulatory Visit (INDEPENDENT_AMBULATORY_CARE_PROVIDER_SITE_OTHER): Payer: 59

## 2022-06-09 ENCOUNTER — Encounter: Payer: Self-pay | Admitting: Orthopaedic Surgery

## 2022-06-09 ENCOUNTER — Ambulatory Visit (INDEPENDENT_AMBULATORY_CARE_PROVIDER_SITE_OTHER): Payer: 59 | Admitting: Orthopaedic Surgery

## 2022-06-09 ENCOUNTER — Encounter: Payer: Self-pay | Admitting: Orthopedic Surgery

## 2022-06-09 ENCOUNTER — Telehealth: Payer: Self-pay | Admitting: Orthopedic Surgery

## 2022-06-09 VITALS — BP 157/91 | HR 95 | Ht 64.0 in | Wt 232.0 lb

## 2022-06-09 DIAGNOSIS — M545 Low back pain, unspecified: Secondary | ICD-10-CM

## 2022-06-09 DIAGNOSIS — F17210 Nicotine dependence, cigarettes, uncomplicated: Secondary | ICD-10-CM | POA: Diagnosis not present

## 2022-06-09 MED ORDER — PREDNISONE 5 MG (21) PO TBPK: ORAL_TABLET | ORAL | 0 refills | Status: DC

## 2022-06-09 MED ORDER — HYDROCODONE-ACETAMINOPHEN 5-325 MG PO TABS: ORAL_TABLET | ORAL | 0 refills | Status: DC

## 2022-06-09 NOTE — Telephone Encounter (Signed)
Patient needs her note to be changed, she has to be out 7 consecutive days for her work to approve the time off.  She is in the lobby and would like to speak with Dr. Dallas Schimke

## 2022-06-09 NOTE — Patient Instructions (Signed)
Out of work note for one week Pick up medications at pharmacy Call if you have any problems

## 2022-06-09 NOTE — Progress Notes (Signed)
Subjective:    Patient ID: Amanda Blackwell, female    DOB: 03/07/1975, 47 y.o.   MRN: [id number]  HPI She has had pain in the lower back and right hip for several weeks now getting worse.  She went to Urgent Care last week and had X-rays and exam.  She had X-rays of the right hip which were negative.  She was given muscle relaxant and patches which do not help that much.  She has pain from the lower back that goes to the right hip and down the leg past the knee at times but not to the foot.  She has more pain after working.  She has to wear protective footwear at work and the weight of that bothers her more.  She cannot take NSAIDs.  She has no spasm but has tightness of the lower back more in the evening.  She has no trauma.  She has no weakness.   Review of Systems  Constitutional:  Positive for activity change.  Musculoskeletal:  Positive for arthralgias, back pain, gait problem and myalgias.  Allergic/Immunologic: Positive for environmental allergies.  Psychiatric/Behavioral:  The patient is nervous/anxious.   All other systems reviewed and are negative. For Review of Systems, all other systems reviewed and are negative.  The following is a summary of the past history medically, past history surgically, known current medicines, social history and family history.  This information is gathered electronically by the computer from prior information and documentation.  I review this each visit and have found including this information at this point in the chart is beneficial and informative.   Past Medical History:  Diagnosis Date   Anxiety    Asthma    with seasonal changes   Contraceptive management 09/11/2013   Depression    Deviated septum 06/2012   Family history of colon cancer in mother 01/03/2015   Frequent urination    GERD (gastroesophageal reflux disease)    Headache(784.0)    sinus   IC (interstitial cystitis) 01/31/2015   Nasal turbinate hypertrophy 06/2012    bilateral   OAB (overactive bladder) 02/16/2013   Trial myrbetriq   PONV (postoperative nausea and vomiting)    Seasonal allergies    Stomach pain 01/03/2015   Unspecified symptom associated with female genital organs 02/14/2013   Vaginal discharge 02/14/2013    Past Surgical History:  Procedure Laterality Date   CESAREAN SECTION  10/20/2006   CHOLECYSTECTOMY  07/14/2005   laparoscopic   COLONOSCOPY WITH PROPOFOL N/A 05/02/2020   Procedure: COLONOSCOPY WITH PROPOFOL;  Surgeon: Lanelle Bal, DO;  [facility];  Service: Endoscopy;  Laterality: N/A;  AM, pt tested + 02/29/20, results emailed to [facility]   CYST EXCISION Left 08/14/2001   postauricular   dilation and currettage     NASAL SEPTOPLASTY W/ TURBINOPLASTY Bilateral 07/24/2012   Procedure: NASAL SEPTOPLASTY WITH BILATERAL TURBINATE RESECTION;  Surgeon: Darletta Moll, MD;  Location: [facility];  Service: ENT;  Laterality: Bilateral;   POLYPECTOMY  05/02/2020   Procedure: POLYPECTOMY;  Surgeon: Lanelle Bal, DO;  [facility];  Service: Endoscopy;;  cecal polyp    Current Outpatient Medications on File Prior to Visit  Medication Sig Dispense Refill   ALPRAZolam (XANAX) 0.5 MG tablet Take 1 tablet (0.5 mg total) by mouth at bedtime as needed. 30 tablet 5   Ascorbic Acid (VITAMIN C) 500 MG CHEW Chew 500 mg by mouth at bedtime.     buPROPion (  WELLBUTRIN XL) 150 MG 24 hr tablet TAKE 3 TABLETS (450 MG) EVERY MORNING 270 tablet 2   cetirizine (ZYRTEC) 10 MG tablet Take 10 mg by mouth daily.     desogestrel-ethinyl estradiol (VELIVET) 0.1/0.125/0.15 -0.025 MG tablet TAKE 1 TABLET BY MOUTH EVERY DAY 84 tablet 3   diclofenac (VOLTAREN) 75 MG EC tablet Take 1 tablet (75 mg total) by mouth 2 (two) times daily. 14 tablet 0   ELMIRON 100 MG capsule TAKE 2 CAPSULES BY MOUTH TWICE A DAY 120 capsule 11   fluticasone (FLONASE) 50 MCG/ACT nasal spray Place 1 spray into both nostrils daily as needed for allergies  or rhinitis.     lidocaine (LIDODERM) 5 % Place 1 patch onto the skin daily. Remove & Discard patch within 12 hours or as directed by MD 30 patch 0   methocarbamol (ROBAXIN) 500 MG tablet Take 1 tablet (500 mg total) by mouth 2 (two) times daily. 20 tablet 0   Nystatin (GERHARDT'S BUTT CREAM) CREA Apply 1 application. topically 2 (two) times daily. 1 each 11   pantoprazole (PROTONIX) 40 MG tablet Take 40 mg by mouth daily.     PROAIR HFA 108 (90 Base) MCG/ACT inhaler Inhale 2 puffs into the lungs every 4 (four) hours as needed for shortness of breath or wheezing. 1 each 11   Pseudoephedrine-Ibuprofen (ADVIL COLD/SINUS PO) Take 1 tablet by mouth daily as needed (sinus pressure).     rosuvastatin (CRESTOR) 10 MG tablet Take 10 mg by mouth every Monday, Wednesday, and Friday.     sucralfate (CARAFATE) 1 g tablet Take 1 tablet (1 g total) by mouth 3 (three) times daily as needed. Dissolve 1 tablet into a glass of water and drink as needed 60 tablet 1   tolterodine (DETROL LA) 4 MG 24 hr capsule TAKE 1 CAPSULE BY MOUTH EVERY DAY 90 capsule 0   VITAMIN D PO Take 10,000 Units by mouth.     No current facility-administered medications on file prior to visit.    Social History   Socioeconomic History   Marital status: Married    Spouse name: Not on file   Number of children: Not on file   Years of education: Not on file   Highest education level: Not on file  Occupational History   Not on file  Tobacco Use   Smoking status: Every Day    Types: E-cigarettes    Last attempt to quit: 11/26/2010    Years since quitting: 11.5   Smokeless tobacco: Never   Tobacco comments:    vapes  Vaping Use   Vaping Use: Every day  Substance and Sexual Activity   Alcohol use: Yes    Comment: occ   Drug use: No   Sexual activity: Yes    Birth control/protection: Pill  Other Topics Concern   Not on file  Social History Narrative   Not on file   Social Determinants of Health   Financial Resource  Strain: Low Risk  (12/10/2021)   Overall Financial Resource Strain (CARDIA)    Difficulty of Paying Living Expenses: Not hard at all  Food Insecurity: No Food Insecurity (12/10/2021)   Hunger Vital Sign    Worried About Running Out of Food in the Last Year: Never true    Ran Out of Food in the Last Year: Never true  Transportation Needs: No Transportation Needs (12/10/2021)   PRAPARE - Administrator, Civil Service (Medical): No    Lack of Transportation (Non-Medical):  No  Physical Activity: Insufficiently Active (12/10/2021)   Exercise Vital Sign    Days of Exercise per Week: 2 days    Minutes of Exercise per Session: 30 min  Stress: Stress Concern Present (12/10/2021)   [employer] of Occupational Health - Occupational Stress Questionnaire    Feeling of Stress : To some extent  Social Connections: Moderately Integrated (12/10/2021)   Social Connection and Isolation Panel [NHANES]    Frequency of Communication with Friends and Family: More than three times a week    Frequency of Social Gatherings with Friends and Family: Three times a week    Attends Religious Services: 1 to 4 times per year    Active Member of Clubs or Organizations: No    Attends Banker Meetings: Never    Marital Status: Married  Catering manager Violence: Not At Risk (12/10/2021)   Humiliation, Afraid, Rape, and Kick questionnaire    Fear of Current or Ex-Partner: No    Emotionally Abused: No    Physically Abused: No    Sexually Abused: No    Family History  Problem Relation Age of Onset   Anesthesia problems Maternal Grandmother        hard to wake up post-op   Cancer Maternal Grandmother        cervical   Congestive Heart Failure Maternal Grandmother    Cancer Mother 17       colon   Diabetes Mother    Parkinson's disease Father    ADD / ADHD Daughter    Diabetes Maternal Aunt    Cancer Paternal Grandmother        breast   Breast cancer Paternal Grandmother         ? age of onset   Cancer Paternal Grandfather        lung    BP (!) 157/91   Pulse 95   Ht 5\' 4"  (1.626 m)   Wt 232 lb (105.2 kg)   LMP 05/05/2022 (Approximate)   BMI 39.82 kg/m   Body mass index is 39.82 kg/m.      Objective:   Physical Exam Vitals and nursing note reviewed. Exam conducted with a chaperone present.  Constitutional:      Appearance: She is well-developed.  HENT:     Head: Normocephalic and atraumatic.  Eyes:     Conjunctiva/sclera: Conjunctivae normal.     Pupils: Pupils are equal, round, and reactive to light.  Cardiovascular:     Rate and Rhythm: Normal rate and regular rhythm.  Pulmonary:     Effort: Pulmonary effort is normal.  Abdominal:     Palpations: Abdomen is soft.  Musculoskeletal:       Arms:     Cervical back: Normal range of motion and neck supple.  Skin:    General: Skin is warm and dry.  Neurological:     Mental Status: She is alert and oriented to person, place, and time.     Cranial Nerves: No cranial nerve deficit.     Motor: No abnormal muscle tone.     Coordination: Coordination normal.     Deep Tendon Reflexes: Reflexes are normal and symmetric. Reflexes normal.  Psychiatric:        Behavior: Behavior normal.        Thought Content: Thought content normal.        Judgment: Judgment normal.    X-rays were done of the lumbar spine, reported separately.       Assessment &  Plan:   Encounter Diagnoses  Name Primary?   Acute right-sided low back pain without sciatica Yes   Nicotine dependence, cigarettes, uncomplicated     I will begin prednisone dose pack and pain medicine.  I have reviewed the [place] Controlled Substance Reporting System web site prior to prescribing narcotic medicine for this patient.  Return in one week.  Call if any problem.  Precautions discussed.  Electronically Signed Darreld Mclean, MD 5/15/20249:49 AM

## 2022-06-16 ENCOUNTER — Encounter: Payer: Self-pay | Admitting: Orthopaedic Surgery

## 2022-06-16 ENCOUNTER — Ambulatory Visit: Payer: 59 | Admitting: Orthopedic Surgery

## 2022-06-16 ENCOUNTER — Ambulatory Visit (INDEPENDENT_AMBULATORY_CARE_PROVIDER_SITE_OTHER): Payer: 59 | Admitting: Orthopaedic Surgery

## 2022-06-16 VITALS — BP 146/94 | HR 99 | Ht 64.0 in | Wt 228.0 lb

## 2022-06-16 DIAGNOSIS — M545 Low back pain, unspecified: Secondary | ICD-10-CM

## 2022-06-16 NOTE — Progress Notes (Signed)
My back is better but still hurting.  She had a reaction to the hydrocodone and had itching.  She stopped it.  Her lower back is still tender but not as much after being on the prednisone.    ROM of the lower back is limited secondary to pain. Most of pain is on the right side with tightness, no spasm.  SLR positive on the right at 20 degrees.  Gait is slow.  Slightly weak on the right DTR.  I am concerned about HNP of the lumbar spine.  I will get MRI.  Encounter Diagnosis  Name Primary?   Acute right-sided low back pain without sciatica Yes   Return in two weeks.  I filled out a form for her work.  She says the extra strength tylenol helps as does a rub she uses.  Call if any problem.  Precautions discussed.  Electronically Signed Darreld Mclean, MD 5/22/202410:22 AM

## 2022-06-16 NOTE — Patient Instructions (Signed)
Central scheduling [phone]

## 2022-06-27 ENCOUNTER — Ambulatory Visit
Admission: EM | Admit: 2022-06-27 | Discharge: 2022-06-27 | Disposition: A | Discharge status: Home / Self Care | Payer: 59 | Attending: Nurse Practitioner | Admitting: Nurse Practitioner

## 2022-06-27 DIAGNOSIS — M545 Low back pain, unspecified: Secondary | ICD-10-CM

## 2022-06-27 MED ORDER — KETOROLAC TROMETHAMINE 30 MG/ML IJ SOLN
30.0000 mg | Freq: Once | INTRAMUSCULAR | Status: AC
  Administered 2022-06-27: 30 mg via INTRAMUSCULAR

## 2022-06-27 MED ORDER — LIDOCAINE 5 % EX PTCH: 1.0000 | MEDICATED_PATCH | CUTANEOUS | 0 refills | Status: DC

## 2022-06-27 MED ORDER — CYCLOBENZAPRINE HCL 10 MG PO TABS: 10.0000 mg | ORAL_TABLET | Freq: Two times a day (BID) | ORAL | 0 refills | Status: DC | PRN

## 2022-06-27 NOTE — ED Provider Notes (Signed)
[facility]    CSN: [id number] Arrival date & time: 06/27/22  4782      History   Chief Complaint Chief Complaint  Patient presents with   Back Pain   Hip Pain    HPI Amanda Blackwell is a 47 y.o. female.   Patient presents today for ongoing right low back and hip pain.  Reports pain has been ongoing for the past month and she is following with orthopedic provider; has MRI coming up in approximately 3 weeks.  She denies any new injury to her right low back or hips.  No saddle anesthesia, numbness or tingling going down the leg, lower extremity weakness or decreased sensation.  No redness, bruising, or swelling of the right low back or hip.  Has been using topical NSAID and taking Tylenol arthritis for pain with minimal improvement.  Reports the orthopedic doctor prescribed her hydrocodone, but that made her itch so she stopped taking it.    Past Medical History:  Diagnosis Date   Anxiety    Asthma    with seasonal changes   Contraceptive management 09/11/2013   Depression    Deviated septum 06/2012   Family history of colon cancer in mother 01/03/2015   Frequent urination    GERD (gastroesophageal reflux disease)    Headache(784.0)    sinus   IC (interstitial cystitis) 01/31/2015   Nasal turbinate hypertrophy 06/2012   bilateral   OAB (overactive bladder) 02/16/2013   Trial myrbetriq   PONV (postoperative nausea and vomiting)    Seasonal allergies    Stomach pain 01/03/2015   Unspecified symptom associated with female genital organs 02/14/2013   Vaginal discharge 02/14/2013    Patient Active Problem List   Diagnosis Date Noted   Constipation 04/08/2020   Special screening for malignant neoplasms, colon 08/09/2017   Family hx of colon cancer 08/09/2017   IC (interstitial cystitis) 01/31/2015   Stomach pain 01/03/2015   Family history of colon cancer in mother 01/03/2015   Anxiety 09/11/2013   Contraceptive management 09/11/2013   OAB (overactive  bladder) 02/16/2013   Unspecified symptom associated with female genital organs 02/14/2013   Vaginal discharge 02/14/2013    Past Surgical History:  Procedure Laterality Date   CESAREAN SECTION  10/20/2006   CHOLECYSTECTOMY  07/14/2005   laparoscopic   COLONOSCOPY WITH PROPOFOL N/A 05/02/2020   Procedure: COLONOSCOPY WITH PROPOFOL;  Surgeon: Lanelle Bal, DO;  Location: [facility];  Service: Endoscopy;  Laterality: N/A;  AM, pt tested + 02/29/20, results emailed to [facility]   CYST EXCISION Left 08/14/2001   postauricular   dilation and currettage     NASAL SEPTOPLASTY W/ TURBINOPLASTY Bilateral 07/24/2012   Procedure: NASAL SEPTOPLASTY WITH BILATERAL TURBINATE RESECTION;  Surgeon: Darletta Moll, MD;  Location: [facility];  Service: ENT;  Laterality: Bilateral;   POLYPECTOMY  05/02/2020   Procedure: POLYPECTOMY;  Surgeon: Lanelle Bal, DO;  Location: [facility];  Service: Endoscopy;;  cecal polyp    OB History     Gravida  2   Para  1   Term  1   Preterm      AB  1   Living  1      SAB  1   IAB      Ectopic      Multiple      Live Births  1            Home Medications  Prior to Admission medications   Medication Sig Start Date End Date Taking? Authorizing Provider  cyclobenzaprine (FLEXERIL) 10 MG tablet Take 1 tablet (10 mg total) by mouth 2 (two) times daily as needed for muscle spasms. Do not take with alcohol or while driving or operating heavy machinery.  May cause drowsiness. 06/27/22  Yes Valentino Nose, NP  ALPRAZolam Prudy Feeler) 0.5 MG tablet Take 1 tablet (0.5 mg total) by mouth at bedtime as needed. 04/27/22   Lazaro Arms, MD  Ascorbic Acid (VITAMIN C) 500 MG CHEW Chew 500 mg by mouth at bedtime.    [provider]  buPROPion (WELLBUTRIN XL) 150 MG 24 hr tablet TAKE 3 TABLETS (450 MG) EVERY MORNING 05/31/22   Adline Potter, NP  cetirizine (ZYRTEC) 10 MG tablet Take 10 mg by mouth daily.    [provider]   desogestrel-ethinyl estradiol (VELIVET) 0.1/0.125/0.15 -0.025 MG tablet TAKE 1 TABLET BY MOUTH EVERY DAY 12/28/21   Adline Potter, NP  diclofenac (VOLTAREN) 75 MG EC tablet Take 1 tablet (75 mg total) by mouth 2 (two) times daily. 05/30/22   Raspet, Noberto Retort, PA-C  ELMIRON 100 MG capsule TAKE 2 CAPSULES BY MOUTH TWICE A DAY 06/29/21   Adline Potter, NP  fluticasone (FLONASE) 50 MCG/ACT nasal spray Place 1 spray into both nostrils daily as needed for allergies or rhinitis.    [provider]  lidocaine (LIDODERM) 5 % Place 1 patch onto the skin daily. Remove & Discard patch within 12 hours or as directed by MD 06/27/22   Valentino Nose, NP  Nystatin (GERHARDT'S BUTT CREAM) CREA Apply 1 application. topically 2 (two) times daily. 05/19/21   Lazaro Arms, MD  pantoprazole (PROTONIX) 40 MG tablet Take 40 mg by mouth daily. 05/15/21   [provider]  PROAIR HFA 108 (90 Base) MCG/ACT inhaler Inhale 2 puffs into the lungs every 4 (four) hours as needed for shortness of breath or wheezing. 05/19/22   Lazaro Arms, MD  Pseudoephedrine-Ibuprofen (ADVIL COLD/SINUS PO) Take 1 tablet by mouth daily as needed (sinus pressure).    [provider]  rosuvastatin (CRESTOR) 10 MG tablet Take 10 mg by mouth every Monday, Wednesday, and Friday. 12/12/19   [provider]  sucralfate (CARAFATE) 1 g tablet Take 1 tablet (1 g total) by mouth 3 (three) times daily as needed. Dissolve 1 tablet into a glass of water and drink as needed 11/24/20   Particia Nearing, PA-C  tolterodine (DETROL LA) 4 MG 24 hr capsule TAKE 1 CAPSULE BY MOUTH EVERY DAY 12/17/20   Cheral Marker, CNM  VITAMIN D PO Take 10,000 Units by mouth.    [provider]    Family History Family History  Problem Relation Age of Onset   Anesthesia problems Maternal Grandmother        hard to wake up post-op   Cancer Maternal Grandmother        cervical   Congestive Heart Failure Maternal  Grandmother    Cancer Mother 89       colon   Diabetes Mother    Parkinson's disease Father    ADD / ADHD Daughter    Diabetes Maternal Aunt    Cancer Paternal Grandmother        breast   Breast cancer Paternal Grandmother        ? age of onset   Cancer Paternal Grandfather        lung  Social History Social History   Tobacco Use   Smoking status: Every Day    Types: E-cigarettes    Last attempt to quit: 11/26/2010    Years since quitting: 11.5   Smokeless tobacco: Never   Tobacco comments:    vapes  Vaping Use   Vaping Use: Every day  Substance Use Topics   Alcohol use: Yes    Comment: occ   Drug use: No     Allergies   Hydrocodone and Latex   Review of Systems Review of Systems Per HPI  Physical Exam Triage Vital Signs ED Triage Vitals  Enc Vitals Group     BP 06/27/22 0909 128/84     Pulse Rate 06/27/22 0909 88     Resp 06/27/22 0909 18     Temp 06/27/22 0909 98.4 F (36.9 C)     Temp Source 06/27/22 0909 Oral     SpO2 06/27/22 0909 96 %     Weight --      Height --      Head Circumference --      Peak Flow --      Pain Score 06/27/22 0908 6     Pain Loc --      Pain Edu? --      Excl. in GC? --    No data found.  Updated Vital Signs BP 128/84 (BP Location: Right Arm)   Pulse 88   Temp 98.4 F (36.9 C) (Oral)   Resp 18   LMP  (LMP Unknown)   SpO2 96%   Visual Acuity Right Eye Distance:   Left Eye Distance:   Bilateral Distance:    Right Eye Near:   Left Eye Near:    Bilateral Near:     Physical Exam Vitals and nursing note reviewed.  Constitutional:      General: She is not in acute distress.    Appearance: Normal appearance. She is not toxic-appearing.     Comments: Uncomfortable appearing  HENT:     Mouth/Throat:     Mouth: Mucous membranes are moist.     Pharynx: Oropharynx is clear.  Pulmonary:     Effort: Pulmonary effort is normal. No respiratory distress.  Musculoskeletal:       Legs:     Comments:  Inspection: No swelling, obvious deformity, redness, or bruising to right low back or upper glute Palpation: Right low back/glute tender to palpation in approximately area marked; no obvious deformities palpated ROM: full ROM bilateral lower extremities Strength: 5/5 bilateral lower extremities Neurovascular: neurovascularly intact in left and right lower extremity   Skin:    General: Skin is warm and dry.     Capillary Refill: Capillary refill takes less than 2 seconds.     Coloration: Skin is not jaundiced or pale.     Findings: No erythema.  Neurological:     Mental Status: She is alert and oriented to person, place, and time.  Psychiatric:        Behavior: Behavior is cooperative.      UC Treatments / Results  Labs (all labs ordered are listed, but only abnormal results are displayed) Labs Reviewed - No data to display  EKG   Radiology No results found.  Procedures Procedures (including critical care time)  Medications Ordered in UC Medications  ketorolac (TORADOL) 30 MG/ML injection 30 mg (30 mg Intramuscular Given 06/27/22 0948)    Initial Impression / Assessment and Plan / UC Course  I have reviewed the triage  vital signs and the nursing notes.  Pertinent labs & imaging results that were available during my care of the patient were reviewed by me and considered in my medical decision making (see chart for details).   Patient is well-appearing, normotensive, afebrile, not tachycardic, not tachypneic, oxygenating well on room air.    1. Acute right-sided low back pain without sciatica Pain was treated with Toradol 30 mg IM today in urgent care Recommended avoidance of NSAIDs for the next 24 to 48 hours Restart lidocaine patches, start cyclobenzaprine as needed for muscular pain Recommended calling orthopedic provider tomorrow to discuss further treatment options  The patient was given the opportunity to ask questions.  All questions answered to their satisfaction.   The patient is in agreement to this plan.    Final Clinical Impressions(s) / UC Diagnoses   Final diagnoses:  Acute right-sided low back pain without sciatica     Discharge Instructions      We have given you an antiinflammatory injection today to help with pain and inflammation.  Do not take any other NSAIDs by mouth for 24 hours (ibuprofen, Aleve, etc).  Restart lidocaine patches, take the cyclobenzaprine as needed for muscle spasms.  Recommend calling orthopedic office tomorrow morning to discuss ongoing pain.    ED Prescriptions     Medication Sig Dispense Auth. Provider   lidocaine (LIDODERM) 5 % Place 1 patch onto the skin daily. Remove & Discard patch within 12 hours or as directed by MD 30 patch Valentino Nose, NP   cyclobenzaprine (FLEXERIL) 10 MG tablet Take 1 tablet (10 mg total) by mouth 2 (two) times daily as needed for muscle spasms. Do not take with alcohol or while driving or operating heavy machinery.  May cause drowsiness. 20 tablet Valentino Nose, NP      PDMP not reviewed this encounter.   Valentino Nose, NP 06/27/22 [phone]

## 2022-06-27 NOTE — Discharge Instructions (Signed)
We have given you an antiinflammatory injection today to help with pain and inflammation.  Do not take any other NSAIDs by mouth for 24 hours (ibuprofen, Aleve, etc).  Restart lidocaine patches, take the cyclobenzaprine as needed for muscle spasms.  Recommend calling orthopedic office tomorrow morning to discuss ongoing pain.

## 2022-06-27 NOTE — ED Triage Notes (Signed)
Pt reports having intermittent lower right back pain and right hip pain (MRI scheduled for 6/20).   Started: got worse last night  Home interventions: topical pain relief, tylenol arthritis

## 2022-06-28 ENCOUNTER — Ambulatory Visit: Payer: 59 | Admitting: Obstetrics & Gynecology

## 2022-06-29 ENCOUNTER — Encounter: Payer: Self-pay | Admitting: Orthopaedic Surgery

## 2022-06-29 ENCOUNTER — Ambulatory Visit (INDEPENDENT_AMBULATORY_CARE_PROVIDER_SITE_OTHER): Payer: 59 | Admitting: Orthopaedic Surgery

## 2022-06-29 VITALS — BP 151/93 | HR 95 | Ht 64.0 in | Wt 235.0 lb

## 2022-06-29 DIAGNOSIS — M545 Low back pain, unspecified: Secondary | ICD-10-CM | POA: Diagnosis not present

## 2022-06-29 MED ORDER — DIAZEPAM 5 MG PO TABS: ORAL_TABLET | ORAL | 0 refills | Status: DC

## 2022-06-29 NOTE — Patient Instructions (Addendum)
MRI at [facility] tomorrow at 8pm you will have to register in the emergency room, since they do not have any front desk coverage in the evening take this paperwork with you and tell them you are there to register for a MRI scan not to be seen through the ER arrive at 730  OUT OF WORK 7 working days.

## 2022-06-29 NOTE — Progress Notes (Signed)
I am worse  She went to Urgent Care two days ago because of significant pain of the lower back. I have reviewed the notes.  She cannot take codeine or any other pain medicine secondary to itching.  She has been on tylenol.  She has lower back pain that is not improving.  Her MRI is for three weeks from now, too long a time in my opinion.  I have read the Urgent Care notes.  We have been able to change her MRI to tomorrow.  Lower back is very painful.  ROM is decreased.  NV intact. SLR positive on right at 20 degrees.  No spasm.  Gait is slow.  Muscle tone and strength intact.  Encounter Diagnosis  Name Primary?   Lumbar back pain Yes   Return in one week.  Stay out of work.  I will give diazepam 5 for her back spasm.  Stop the Xanax she takes at night.  Call if any problem.  Precautions discussed.  Electronically Signed Darreld Mclean, MD 6/4/20249:41 AM

## 2022-06-30 ENCOUNTER — Ambulatory Visit (HOSPITAL_COMMUNITY)
Admission: RE | Admit: 2022-06-30 | Discharge: 2022-06-30 | Disposition: A | Discharge status: Home / Self Care | Payer: 59 | Source: Ambulatory Visit | Attending: Orthopaedic Surgery | Admitting: Orthopaedic Surgery

## 2022-06-30 DIAGNOSIS — M545 Low back pain, unspecified: Secondary | ICD-10-CM | POA: Insufficient documentation

## 2022-07-07 ENCOUNTER — Encounter: Payer: Self-pay | Admitting: Orthopaedic Surgery

## 2022-07-07 ENCOUNTER — Ambulatory Visit (INDEPENDENT_AMBULATORY_CARE_PROVIDER_SITE_OTHER): Payer: 59 | Admitting: Orthopaedic Surgery

## 2022-07-07 VITALS — BP 155/90 | HR 97 | Ht 64.0 in | Wt 233.0 lb

## 2022-07-07 DIAGNOSIS — M545 Low back pain, unspecified: Secondary | ICD-10-CM

## 2022-07-07 MED ORDER — METHYLPREDNISOLONE ACETATE 40 MG/ML IJ SUSP
40.0000 mg | Freq: Once | INTRAMUSCULAR | Status: AC
  Administered 2022-07-07: 40 mg via INTRA_ARTICULAR

## 2022-07-07 NOTE — Progress Notes (Signed)
I still hurt.  She was seen for MRI of the lower back and it showed: IMPRESSION: 1. Lower lumbar spine predominant degenerative changes with moderate to severe facet degenerative changes at L4-L5 and L5-S1 with resulting mild neuroforaminal narrowing, as above. 2. No spinal canal narrowing.   I have explained the findings to her.  I will have her see Dr. Alvester Morin for epidural injection.  I have independently reviewed the MRI.    Lower back is tender, decreased ROM, NV intact, SLR negative, muscle tone and strength normal.  Encounter Diagnosis  Name Primary?   Lumbar back pain Yes   To see Dr. Alvester Morin.  She can return to work Friday.  Return here in six weeks.  Call if any problem.  Precautions discussed.  Electronically Signed Darreld Mclean, MD 6/12/20249:04 AM

## 2022-07-07 NOTE — Addendum Note (Signed)
Addended by: Michaele Offer on: 07/07/2022 10:34 AM   Modules accepted: Orders

## 2022-07-07 NOTE — Patient Instructions (Signed)
My return to work on June 14.

## 2022-07-07 NOTE — Progress Notes (Signed)
Ref patient to dr Alvester Morin for Epidural

## 2022-07-15 ENCOUNTER — Other Ambulatory Visit: Payer: Self-pay | Admitting: Nurse Practitioner

## 2022-07-15 ENCOUNTER — Ambulatory Visit (HOSPITAL_COMMUNITY): Payer: 59

## 2022-07-20 ENCOUNTER — Ambulatory Visit: Payer: 59 | Admitting: Orthopaedic Surgery

## 2022-07-20 ENCOUNTER — Telehealth: Payer: Self-pay

## 2022-07-20 DIAGNOSIS — G8929 Other chronic pain: Secondary | ICD-10-CM

## 2022-07-20 NOTE — Telephone Encounter (Signed)
Completed the order

## 2022-08-03 ENCOUNTER — Telehealth: Payer: Self-pay | Admitting: Physical Medicine and Rehabilitation

## 2022-08-03 NOTE — Telephone Encounter (Signed)
Pt came in stating she has been trying to get in contact with [place] to make and appt I advised her about the office being short staffed and she understands please call pt and make appt when available

## 2022-08-05 NOTE — Telephone Encounter (Signed)
Spoke with patient and scheduled injection for 08/16/22. Patient aware driver needed 

## 2022-08-12 ENCOUNTER — Encounter: Payer: Self-pay | Admitting: Obstetrics & Gynecology

## 2022-08-12 ENCOUNTER — Ambulatory Visit (INDEPENDENT_AMBULATORY_CARE_PROVIDER_SITE_OTHER): Payer: 59 | Admitting: Obstetrics & Gynecology

## 2022-08-12 VITALS — BP 142/75 | HR 102 | Ht 64.0 in

## 2022-08-12 DIAGNOSIS — N3281 Overactive bladder: Secondary | ICD-10-CM

## 2022-08-12 DIAGNOSIS — N3010 Interstitial cystitis (chronic) without hematuria: Secondary | ICD-10-CM | POA: Diagnosis not present

## 2022-08-12 NOTE — Progress Notes (Signed)
Diagnosed with IC: 2018  Current Meds:  Elmiron + DMSO q 8 weeks Dietary restrictions  Recently learned she has arthritis of her right hip, will have injections done  Pt states her symptoms have been stable, no exacerbations, although not perfect Wants to continue on this cycle  Blood pressure (!) 142/75, pulse (!) 102, height 5\' 4"  (1.626 m).    The external urethra meatus was prepped with betadine DMSO 50 cc was instilled in the usual fashion after the bladder was catheterized and emptied completely 50cc was instilled into the bladder without difficulty and the patient tolerated well She will refrain from voiding as long as possible  Follow up in 8 weeks, or as patient requests based on her symptom complex

## 2022-08-16 ENCOUNTER — Ambulatory Visit (INDEPENDENT_AMBULATORY_CARE_PROVIDER_SITE_OTHER): Payer: 59 | Admitting: Physical Medicine and Rehabilitation

## 2022-08-16 ENCOUNTER — Other Ambulatory Visit: Payer: Self-pay

## 2022-08-16 VITALS — BP 135/81 | HR 110

## 2022-08-16 DIAGNOSIS — M47816 Spondylosis without myelopathy or radiculopathy, lumbar region: Secondary | ICD-10-CM | POA: Diagnosis not present

## 2022-08-16 MED ORDER — METHYLPREDNISOLONE ACETATE 80 MG/ML IJ SUSP
80.0000 mg | Freq: Once | INTRAMUSCULAR | Status: AC
  Administered 2022-08-16: 80 mg

## 2022-08-16 NOTE — Progress Notes (Signed)
Functional Pain Scale - descriptive words and definitions  Moderate (4)   Constantly aware of pain, can complete ADLs with modification/sleep marginally affected at times/passive distraction is of no use, but active distraction gives some relief. Moderate range order  Average Pain  varies   +Driver, -BT, -Dye Allergies.  Lower back pain on right side that can radiate into right leg

## 2022-08-16 NOTE — Patient Instructions (Signed)

## 2022-08-17 ENCOUNTER — Encounter: Payer: Self-pay | Admitting: Orthopaedic Surgery

## 2022-08-17 ENCOUNTER — Ambulatory Visit (INDEPENDENT_AMBULATORY_CARE_PROVIDER_SITE_OTHER): Payer: 59 | Admitting: Orthopaedic Surgery

## 2022-08-17 VITALS — BP 153/95 | HR 96 | Ht 64.0 in | Wt 234.5 lb

## 2022-08-17 DIAGNOSIS — G8929 Other chronic pain: Secondary | ICD-10-CM

## 2022-08-17 DIAGNOSIS — M5442 Lumbago with sciatica, left side: Secondary | ICD-10-CM | POA: Diagnosis not present

## 2022-08-17 DIAGNOSIS — F17210 Nicotine dependence, cigarettes, uncomplicated: Secondary | ICD-10-CM | POA: Diagnosis not present

## 2022-08-17 DIAGNOSIS — M5441 Lumbago with sciatica, right side: Secondary | ICD-10-CM

## 2022-08-17 MED ORDER — LIDOCAINE 5 % EX PTCH: 1.0000 | MEDICATED_PATCH | CUTANEOUS | 3 refills | Status: DC

## 2022-08-17 MED ORDER — CYCLOBENZAPRINE HCL 10 MG PO TABS: 10.0000 mg | ORAL_TABLET | Freq: Two times a day (BID) | ORAL | 2 refills | Status: DC | PRN

## 2022-08-17 NOTE — Progress Notes (Signed)
I had my back injection yesterday.  I feel good today.  She saw Dr. Alvester Morin yesterday and had the epidural.  She has pain relief today.  Exam is very good of the back with little pain.  Motion good.  NV intact.  No spasm.  Encounter Diagnoses  Name Primary?   Chronic midline low back pain with bilateral sciatica Yes   Nicotine dependence, cigarettes, uncomplicated    I will refill her Lidocaine patches and flexeril.  Return in six weeks.  Call if any problem.  Precautions discussed.  Electronically Signed Darreld Mclean, MD 7/23/20241:42 PM

## 2022-08-25 NOTE — Procedures (Signed)
Lumbar Facet Joint Intra-Articular Injection(s) with Fluoroscopic Guidance  Patient: Amanda Blackwell      Date of Birth: 06-Jul-1975 MRN: [id number] PCP: Assunta Found, MD      Visit Date: 08/16/2022   Universal Protocol:    Date/Time: 08/16/2022  Consent Given By: the patient  Position: PRONE   Additional Comments: Vital signs were monitored before and after the procedure. Patient was prepped and draped in the usual sterile fashion. The correct patient, procedure, and site was verified.   Injection Procedure Details:  Procedure Site One Meds Administered:  Meds ordered this encounter  Medications   methylPREDNISolone acetate (DEPO-MEDROL) injection 80 mg     Laterality: Right  Location/Site:  L4-L5 L5-S1  Needle size: 22 guage  Needle type: Spinal  Needle Placement: Articular  Findings:  -Comments: Excellent flow of contrast producing a partial arthrogram.  Procedure Details: The fluoroscope beam is vertically oriented in AP, and the inferior recess is visualized beneath the lower pole of the inferior apophyseal process, which represents the target point for needle insertion. When direct visualization is difficult the target point is located at the medial projection of the vertebral pedicle. The region overlying each aforementioned target is locally anesthetized with a 1 to 2 ml. volume of 1% Lidocaine without Epinephrine.   The spinal needle was inserted into each of the above mentioned facet joints using biplanar fluoroscopic guidance. A 0.25 to 0.5 ml. volume of Isovue-250 was injected and a partial facet joint arthrogram was obtained. A single spot film was obtained of the resulting arthrogram.    One to 1.25 ml of the steroid/anesthetic solution was then injected into each of the facet joints noted above.   Additional Comments:  No complications occurred Dressing: 2 x 2 sterile gauze and Band-Aid    Post-procedure details: Patient was observed during the  procedure. Post-procedure instructions were reviewed.  Patient left the clinic in stable condition.

## 2022-08-25 NOTE — Progress Notes (Signed)
Amanda Blackwell - 47 y.o. female MRN [id number]  Date of birth: 03-26-1975  Office Visit Note: Visit Date: 08/16/2022 PCP: Assunta Found, MD Referred by: Assunta Found, MD  Subjective: Chief Complaint  Patient presents with   Lower Back - Pain   HPI:  [name] is a 47 y.o. female who comes in today at the request of Dr. Darreld Mclean for planned Right  L4-5 and L5-S1 Lumbar facet/medial branch block with fluoroscopic guidance.  The patient has failed conservative care including home exercise, medications, time and activity modification.  This injection will be diagnostic and hopefully therapeutic.  Please see requesting physician notes for further details and justification.  Exam has shown concordant pain with facet joint loading.   ROS Otherwise per HPI.  Assessment & Plan: Visit Diagnoses:    ICD-10-CM   1. Spondylosis without myelopathy or radiculopathy, lumbar region  M47.816 XR C-ARM NO REPORT    Facet Injection    methylPREDNISolone acetate (DEPO-MEDROL) injection 80 mg      Plan: No additional findings.   Meds & Orders:  Meds ordered this encounter  Medications   methylPREDNISolone acetate (DEPO-MEDROL) injection 80 mg    Orders Placed This Encounter  Procedures   Facet Injection   XR C-ARM NO REPORT    Follow-up: Return for visit to requesting provider as needed.   Procedures: No procedures performed  Lumbar Facet Joint Intra-Articular Injection(s) with Fluoroscopic Guidance  Patient: Amanda Blackwell      Date of Birth: 01-24-1976 MRN: [id number] PCP: Assunta Found, MD      Visit Date: 08/16/2022   Universal Protocol:    Date/Time: 08/16/2022  Consent Given By: the patient  Position: PRONE   Additional Comments: Vital signs were monitored before and after the procedure. Patient was prepped and draped in the usual sterile fashion. The correct patient, procedure, and site was verified.   Injection Procedure Details:  Procedure Site One Meds  Administered:  Meds ordered this encounter  Medications   methylPREDNISolone acetate (DEPO-MEDROL) injection 80 mg     Laterality: Right  Location/Site:  L4-L5 L5-S1  Needle size: 22 guage  Needle type: Spinal  Needle Placement: Articular  Findings:  -Comments: Excellent flow of contrast producing a partial arthrogram.  Procedure Details: The fluoroscope beam is vertically oriented in AP, and the inferior recess is visualized beneath the lower pole of the inferior apophyseal process, which represents the target point for needle insertion. When direct visualization is difficult the target point is located at the medial projection of the vertebral pedicle. The region overlying each aforementioned target is locally anesthetized with a 1 to 2 ml. volume of 1% Lidocaine without Epinephrine.   The spinal needle was inserted into each of the above mentioned facet joints using biplanar fluoroscopic guidance. A 0.25 to 0.5 ml. volume of Isovue-250 was injected and a partial facet joint arthrogram was obtained. A single spot film was obtained of the resulting arthrogram.    One to 1.25 ml of the steroid/anesthetic solution was then injected into each of the facet joints noted above.   Additional Comments:  No complications occurred Dressing: 2 x 2 sterile gauze and Band-Aid    Post-procedure details: Patient was observed during the procedure. Post-procedure instructions were reviewed.  Patient left the clinic in stable condition.    Clinical History: MRI LUMBAR SPINE WITHOUT CONTRAST   TECHNIQUE: Multiplanar, multisequence MR imaging of the lumbar spine was performed. No intravenous contrast was administered.   COMPARISON:  None Available.   FINDINGS: Segmentation:  Standard.   Alignment:  Physiologic.   Vertebrae:  No fracture, evidence of discitis, or bone lesion.   Conus medullaris and cauda equina: Conus extends to the L2 level. Conus and cauda equina appear  normal.   Paraspinal and other soft tissues: Negative.   Disc levels:   T12-L1: Mild bilateral facet degenerative change. Significant disc bulge. No spinal canal narrowing. No neural foraminal narrowing.   L1-L2: Mild bilateral facet degenerative change with fluid in the facet joint on the left. No significant disc bulge. No spinal canal narrowing. No neural foraminal narrowing.   L2-L3: Mild bilateral facet degenerative change. No significant disc bulge. No spinal canal narrowing. No neural foraminal narrowing.   L3-L4: Mild bilateral facet degenerative change. No significant disc bulge. No spinal canal narrowing. No neural foraminal narrowing.   L4-L5: Moderate to severe left and moderate right facet degenerative change. No significant disc bulge. Mild-to-moderate left and mild right neural foraminal narrowing. No spinal canal narrowing.   L5-S1: Moderate to severe bilateral facet degenerative change. No spinal canal narrowing. Mild bilateral neural foraminal narrowing.   IMPRESSION: 1. Lower lumbar spine predominant degenerative changes with moderate to severe facet degenerative changes at L4-L5 and L5-S1 with resulting mild neuroforaminal narrowing, as above. 2. No spinal canal narrowing.     Electronically Signed   By: Lorenza Cambridge M.D.   On: 07/07/2022 07:41     Objective:  VS:  HT:    WT:   BMI:     BP:135/81  HR:(!) 110bpm  TEMP: ( )  RESP:  Physical Exam Vitals and nursing note reviewed.  Constitutional:      General: She is not in acute distress.    Appearance: Normal appearance. She is obese. She is not ill-appearing.  HENT:     Head: Normocephalic and atraumatic.     Right Ear: External ear normal.     Left Ear: External ear normal.  Eyes:     Extraocular Movements: Extraocular movements intact.  Cardiovascular:     Rate and Rhythm: Normal rate.     Pulses: Normal pulses.  Pulmonary:     Effort: Pulmonary effort is normal. No respiratory  distress.  Abdominal:     General: There is no distension.     Palpations: Abdomen is soft.  Musculoskeletal:        General: Tenderness present.     Cervical back: Neck supple.     Right lower leg: No edema.     Left lower leg: No edema.     Comments: Patient has good distal strength with no pain over the greater trochanters.  No clonus or focal weakness.  Skin:    Findings: No erythema, lesion or rash.  Neurological:     General: No focal deficit present.     Mental Status: She is alert and oriented to person, place, and time.     Sensory: No sensory deficit.     Motor: No weakness or abnormal muscle tone.     Coordination: Coordination normal.  Psychiatric:        Mood and Affect: Mood normal.        Behavior: Behavior normal.      Imaging: No results found.

## 2022-09-05 ENCOUNTER — Other Ambulatory Visit: Payer: Self-pay | Admitting: Adult Health

## 2022-09-20 ENCOUNTER — Telehealth: Payer: Self-pay

## 2022-09-20 ENCOUNTER — Other Ambulatory Visit: Payer: Self-pay | Admitting: Physical Medicine and Rehabilitation

## 2022-09-20 DIAGNOSIS — G8929 Other chronic pain: Secondary | ICD-10-CM

## 2022-09-20 NOTE — Telephone Encounter (Signed)
Spoke with patient and she is requesting another injection. Patient states she is having the same pain and it just came back yesterday. She states she had 75% relief from injection. No new falls, accidents or injuries. Please advise

## 2022-10-05 ENCOUNTER — Ambulatory Visit: Payer: 59 | Admitting: Orthopaedic Surgery

## 2022-10-06 ENCOUNTER — Ambulatory Visit (INDEPENDENT_AMBULATORY_CARE_PROVIDER_SITE_OTHER): Payer: 59 | Admitting: Physical Medicine and Rehabilitation

## 2022-10-06 ENCOUNTER — Other Ambulatory Visit: Payer: Self-pay

## 2022-10-06 VITALS — BP 139/86 | HR 88

## 2022-10-06 DIAGNOSIS — M47816 Spondylosis without myelopathy or radiculopathy, lumbar region: Secondary | ICD-10-CM

## 2022-10-06 MED ORDER — BUPIVACAINE HCL 0.5 % IJ SOLN
3.0000 mL | Freq: Once | INTRAMUSCULAR | Status: AC
  Administered 2022-10-06: 3 mL

## 2022-10-06 NOTE — Patient Instructions (Signed)

## 2022-10-06 NOTE — Progress Notes (Signed)
Functional Pain Scale - descriptive words and definitions  Distracting (5)    Aware of pain/able to complete some ADL's but limited by pain/sleep is affected and active distractions are only slightly useful. Moderate range order  Average Pain 4   +Driver, -BT, -Dye Allergies.  Lower back pain in the middle with no radiation

## 2022-10-08 ENCOUNTER — Encounter: Payer: Self-pay | Admitting: Physical Medicine and Rehabilitation

## 2022-10-11 NOTE — Procedures (Signed)
Lumbar Diagnostic Facet Joint Nerve Block with Fluoroscopic Guidance   Patient: Amanda Blackwell      Date of Birth: Feb 02, 1975 MRN: [id number] PCP: Assunta Found, MD      Visit Date: 10/06/2022   Universal Protocol:    Date/Time: 09/16/248:17 PM  Consent Given By: the patient  Position: PRONE  Additional Comments: Vital signs were monitored before and after the procedure. Patient was prepped and draped in the usual sterile fashion. The correct patient, procedure, and site was verified.   Injection Procedure Details:   Procedure diagnoses:  1. Spondylosis without myelopathy or radiculopathy, lumbar region      Meds Administered:  Meds ordered this encounter  Medications   bupivacaine (MARCAINE) 0.5 % (with pres) injection 3 mL     Laterality: Right  Location/Site: L4-L5, L3 and L4 medial branches and L5-S1, L4 medial branch and L5 dorsal ramus  Needle: 5.0 in., 25 ga.  Short bevel or Quincke spinal needle  Needle Placement: Oblique pedical  Findings:   -Comments: There was excellent flow of contrast along the articular pillars without intravascular flow.  Procedure Details: The fluoroscope beam is vertically oriented in AP and then obliqued 15 to 20 degrees to the ipsilateral side of the desired nerve to achieve the "Scotty dog" appearance.  The skin over the target area of the junction of the superior articulating process and the transverse process (sacral ala if blocking the L5 dorsal rami) was locally anesthetized with a 1 ml volume of 1% Lidocaine without Epinephrine.  The spinal needle was inserted and advanced in a trajectory view down to the target.   After contact with periosteum and negative aspirate for blood and CSF, correct placement without intravascular or epidural spread was confirmed by injecting 0.5 ml. of Isovue-250.  A spot radiograph was obtained of this image.    Next, a 0.5 ml. volume of the injectate described above was injected. The needle was  then redirected to the other facet joint nerves mentioned above if needed.  Prior to the procedure, the patient was given a Pain Diary which was completed for baseline measurements.  After the procedure, the patient rated their pain every 30 minutes and will continue rating at this frequency for a total of 5 hours.  The patient has been asked to complete the Diary and return to [place] by mail, fax or hand delivered as soon as possible.   Additional Comments:  No complications occurred Dressing: 2 x 2 sterile gauze and Band-Aid    Post-procedure details: Patient was observed during the procedure. Post-procedure instructions were reviewed.  Patient left the clinic in stable condition.

## 2022-10-11 NOTE — Progress Notes (Signed)
Amanda Blackwell - 47 y.o. female MRN [id number]  Date of birth: 02/13/1975  Office Visit Note: Visit Date: 10/06/2022 PCP: Assunta Found, MD Referred by: Assunta Found, MD  Subjective: Chief Complaint  Patient presents with   Lower Back - Pain   HPI:  Amanda Blackwell is a 47 y.o. female who comes in today for planned repeat Right L4-5 and L5-S1 Lumbar facet/medial branch block with fluoroscopic guidance.  The patient has failed conservative care including home exercise, medications, time and activity modification.  This injection will be diagnostic and hopefully therapeutic.  Please see requesting physician notes for further details and justification.  Exam shows concordant low back pain with facet joint loading and extension. Patient received more than 80% pain relief from prior injection. This would be the second block in a diagnostic double block paradigm.     Referring:Megan Mayford Knife, FNP   ROS Otherwise per HPI.  Assessment & Plan: Visit Diagnoses:    ICD-10-CM   1. Spondylosis without myelopathy or radiculopathy, lumbar region  M47.816 XR C-ARM NO REPORT    Facet Injection    bupivacaine (MARCAINE) 0.5 % (with pres) injection 3 mL      Plan: No additional findings.   Meds & Orders:  Meds ordered this encounter  Medications   bupivacaine (MARCAINE) 0.5 % (with pres) injection 3 mL    Orders Placed This Encounter  Procedures   Facet Injection   XR C-ARM NO REPORT    Follow-up: Return for Review Pain Diary.   Procedures: No procedures performed  Lumbar Diagnostic Facet Joint Nerve Block with Fluoroscopic Guidance   Patient: Amanda Blackwell      Date of Birth: [birth year] MRN: [id number] PCP: Assunta Found, MD      Visit Date: 10/06/2022   Universal Protocol:    Date/Time: 09/16/248:17 PM  Consent Given By: the patient  Position: PRONE  Additional Comments: Vital signs were monitored before and after the procedure. Patient was prepped and draped in the  usual sterile fashion. The correct patient, procedure, and site was verified.   Injection Procedure Details:   Procedure diagnoses:  1. Spondylosis without myelopathy or radiculopathy, lumbar region      Meds Administered:  Meds ordered this encounter  Medications   bupivacaine (MARCAINE) 0.5 % (with pres) injection 3 mL     Laterality: Right  Location/Site: L4-L5, L3 and L4 medial branches and L5-S1, L4 medial branch and L5 dorsal ramus  Needle: 5.0 in., 25 ga.  Short bevel or Quincke spinal needle  Needle Placement: Oblique pedical  Findings:   -Comments: There was excellent flow of contrast along the articular pillars without intravascular flow.  Procedure Details: The fluoroscope beam is vertically oriented in AP and then obliqued 15 to 20 degrees to the ipsilateral side of the desired nerve to achieve the "Scotty dog" appearance.  The skin over the target area of the junction of the superior articulating process and the transverse process (sacral ala if blocking the L5 dorsal rami) was locally anesthetized with a 1 ml volume of 1% Lidocaine without Epinephrine.  The spinal needle was inserted and advanced in a trajectory view down to the target.   After contact with periosteum and negative aspirate for blood and CSF, correct placement without intravascular or epidural spread was confirmed by injecting 0.5 ml. of Isovue-250.  A spot radiograph was obtained of this image.    Next, a 0.5 ml. volume of the injectate described above was injected. The needle  was then redirected to the other facet joint nerves mentioned above if needed.  Prior to the procedure, the patient was given a Pain Diary which was completed for baseline measurements.  After the procedure, the patient rated their pain every 30 minutes and will continue rating at this frequency for a total of 5 hours.  The patient has been asked to complete the Diary and return to [place] by mail, fax or hand delivered as soon as  possible.   Additional Comments:  No complications occurred Dressing: 2 x 2 sterile gauze and Band-Aid    Post-procedure details: Patient was observed during the procedure. Post-procedure instructions were reviewed.  Patient left the clinic in stable condition.   Clinical History: MRI LUMBAR SPINE WITHOUT CONTRAST   TECHNIQUE: Multiplanar, multisequence MR imaging of the lumbar spine was performed. No intravenous contrast was administered.   COMPARISON:  None Available.   FINDINGS: Segmentation:  Standard.   Alignment:  Physiologic.   Vertebrae:  No fracture, evidence of discitis, or bone lesion.   Conus medullaris and cauda equina: Conus extends to the L2 level. Conus and cauda equina appear normal.   Paraspinal and other soft tissues: Negative.   Disc levels:   T12-L1: Mild bilateral facet degenerative change. Significant disc bulge. No spinal canal narrowing. No neural foraminal narrowing.   L1-L2: Mild bilateral facet degenerative change with fluid in the facet joint on the left. No significant disc bulge. No spinal canal narrowing. No neural foraminal narrowing.   L2-L3: Mild bilateral facet degenerative change. No significant disc bulge. No spinal canal narrowing. No neural foraminal narrowing.   L3-L4: Mild bilateral facet degenerative change. No significant disc bulge. No spinal canal narrowing. No neural foraminal narrowing.   L4-L5: Moderate to severe left and moderate right facet degenerative change. No significant disc bulge. Mild-to-moderate left and mild right neural foraminal narrowing. No spinal canal narrowing.   L5-S1: Moderate to severe bilateral facet degenerative change. No spinal canal narrowing. Mild bilateral neural foraminal narrowing.   IMPRESSION: 1. Lower lumbar spine predominant degenerative changes with moderate to severe facet degenerative changes at L4-L5 and L5-S1 with resulting mild neuroforaminal narrowing, as above. 2.  No spinal canal narrowing.     Electronically Signed   By: Lorenza Cambridge M.D.   On: 07/07/2022 07:41     Objective:  VS:  HT:    WT:   BMI:     BP:139/86  HR:88bpm  TEMP: ( )  RESP:  Physical Exam Vitals and nursing note reviewed.  Constitutional:      General: She is not in acute distress.    Appearance: Normal appearance. She is obese. She is not ill-appearing.  HENT:     Head: Normocephalic and atraumatic.     Right Ear: External ear normal.     Left Ear: External ear normal.  Eyes:     Extraocular Movements: Extraocular movements intact.  Cardiovascular:     Rate and Rhythm: Normal rate.     Pulses: Normal pulses.  Pulmonary:     Effort: Pulmonary effort is normal. No respiratory distress.  Abdominal:     General: There is no distension.     Palpations: Abdomen is soft.  Musculoskeletal:        General: Tenderness present.     Cervical back: Neck supple.     Right lower leg: No edema.     Left lower leg: No edema.     Comments: Patient has good distal strength with no pain  over the greater trochanters.  No clonus or focal weakness.  Skin:    Findings: No erythema, lesion or rash.  Neurological:     General: No focal deficit present.     Mental Status: She is alert and oriented to person, place, and time.     Sensory: No sensory deficit.     Motor: No weakness or abnormal muscle tone.     Coordination: Coordination normal.  Psychiatric:        Mood and Affect: Mood normal.        Behavior: Behavior normal.      Imaging: No results found.

## 2022-10-12 ENCOUNTER — Other Ambulatory Visit: Payer: Self-pay | Admitting: Physical Medicine and Rehabilitation

## 2022-10-12 DIAGNOSIS — M47816 Spondylosis without myelopathy or radiculopathy, lumbar region: Secondary | ICD-10-CM

## 2022-10-12 DIAGNOSIS — M545 Low back pain, unspecified: Secondary | ICD-10-CM

## 2022-10-12 NOTE — Progress Notes (Signed)
Per pain diary, second set of right L4-L5 and L5-S1 medial branch blocks gave her greater than 80% relief of pain. Order placed for radiofrequency ablation.

## 2022-10-14 ENCOUNTER — Ambulatory Visit: Payer: 59 | Admitting: Obstetrics & Gynecology

## 2022-10-14 ENCOUNTER — Encounter: Payer: Self-pay | Admitting: Obstetrics & Gynecology

## 2022-10-14 VITALS — BP 134/83 | HR 103 | Ht 64.0 in | Wt 239.0 lb

## 2022-10-14 DIAGNOSIS — N3010 Interstitial cystitis (chronic) without hematuria: Secondary | ICD-10-CM | POA: Diagnosis not present

## 2022-10-14 NOTE — Progress Notes (Signed)
Diagnosed with IC: 2018  Current Meds:  Elmiron + DMSO Dietary restrictions    Pt states her symptoms have been stable, no exacerbations, although not perfect Wants to continue on this cycle  Blood pressure 134/83, pulse (!) 103, height 5\' 4"  (1.626 m), weight 239 lb (108.4 kg).    The external urethra meatus was prepped with betadine DMSO 50 cc was instilled in the usual fashion after the bladder was catheterized and emptied completely 50cc was instilled into the bladder without difficulty and the patient tolerated well She will refrain from voiding as long as possible  Follow up in 8 weeks, or as patient requests based on her symptom complex

## 2022-10-27 ENCOUNTER — Telehealth: Payer: Self-pay

## 2022-10-27 NOTE — Telephone Encounter (Signed)
Spoke with patient and rescheduled injection for 11/08/22. Patient aware driver needed

## 2022-10-31 ENCOUNTER — Other Ambulatory Visit: Payer: Self-pay | Admitting: Adult Health

## 2022-10-31 ENCOUNTER — Other Ambulatory Visit: Payer: Self-pay | Admitting: Obstetrics & Gynecology

## 2022-11-02 ENCOUNTER — Encounter: Payer: 59 | Admitting: Physical Medicine and Rehabilitation

## 2022-11-08 ENCOUNTER — Ambulatory Visit: Payer: 59 | Admitting: Physical Medicine and Rehabilitation

## 2022-11-08 ENCOUNTER — Other Ambulatory Visit: Payer: Self-pay

## 2022-11-08 VITALS — BP 135/84 | HR 102

## 2022-11-08 DIAGNOSIS — M47816 Spondylosis without myelopathy or radiculopathy, lumbar region: Secondary | ICD-10-CM

## 2022-11-08 MED ORDER — METHYLPREDNISOLONE ACETATE 40 MG/ML IJ SUSP
40.0000 mg | Freq: Once | INTRAMUSCULAR | Status: AC
Start: 2022-11-08 — End: 2022-11-08
  Administered 2022-11-08: 40 mg

## 2022-11-08 NOTE — Patient Instructions (Signed)

## 2022-11-08 NOTE — Progress Notes (Signed)
Functional Pain Scale - descriptive words and definitions  Moderate (4)   Constantly aware of pain, can complete ADLs with modification/sleep marginally affected at times/passive distraction is of no use, but active distraction gives some relief. Moderate range order  Average Pain 4-7   +Driver, -BT, -Dye Allergies.   Not having the spasms in her back now, but does still have the achy pain in the lower back and right posterior hip.

## 2022-11-09 ENCOUNTER — Other Ambulatory Visit: Payer: Self-pay | Admitting: Obstetrics & Gynecology

## 2022-11-09 ENCOUNTER — Telehealth: Payer: Self-pay | Admitting: *Deleted

## 2022-11-09 MED ORDER — ALPRAZOLAM 0.5 MG PO TABS: 0.5000 mg | ORAL_TABLET | Freq: Every evening | ORAL | 5 refills | Status: DC | PRN

## 2022-11-09 NOTE — Telephone Encounter (Signed)
Pt is requesting refills on Xanax. Thanks! JSY

## 2022-11-18 ENCOUNTER — Other Ambulatory Visit: Payer: Self-pay | Admitting: Obstetrics & Gynecology

## 2022-11-18 DIAGNOSIS — Z1231 Encounter for screening mammogram for malignant neoplasm of breast: Secondary | ICD-10-CM

## 2022-11-19 ENCOUNTER — Encounter: Payer: Self-pay | Admitting: Obstetrics & Gynecology

## 2022-11-19 MED ORDER — PHENAZOPYRIDINE HCL 200 MG PO TABS: 200.0000 mg | ORAL_TABLET | Freq: Three times a day (TID) | ORAL | 2 refills | Status: DC | PRN

## 2022-11-21 NOTE — Progress Notes (Signed)
Amanda Blackwell - 47 y.o. female MRN [id number]  Date of birth: 26-Oct-1975  Office Visit Note: Visit Date: 11/08/2022 PCP: Assunta Found, MD Referred by: Assunta Found, MD  Subjective: Chief Complaint  Patient presents with   Lower Back - Pain   HPI:  Amanda Blackwell is a 47 y.o. female who comes in todayfor planned radiofrequency ablation of the Right L4-5 and L5-S1 Lumbar facet joints. This would be ablation of the corresponding medial branches and/or dorsal rami.  Patient has had double diagnostic blocks with more than 50% relief.  These are documented on pain diary.  They have had chronic back pain for quite some time, more than 3 months, which has been an ongoing situation with recalcitrant axial back pain.  They have no radicular pain.  Their axial pain is worse with standing and ambulating and on exam today with facet loading.  They have had physical therapy as well as home exercise program.  The imaging noted in the chart below indicated facet pathology. Accordingly they meet all the criteria and qualification for for radiofrequency ablation and we are going to complete this today hopefully for more longer term relief as part of comprehensive management program.   ROS Otherwise per HPI.  Assessment & Plan: Visit Diagnoses:    ICD-10-CM   1. Spondylosis without myelopathy or radiculopathy, lumbar region  M47.816 XR C-ARM NO REPORT    Radiofrequency,Lumbar    methylPREDNISolone acetate (DEPO-MEDROL) injection 40 mg      Plan: No additional findings.   Meds & Orders:  Meds ordered this encounter  Medications   methylPREDNISolone acetate (DEPO-MEDROL) injection 40 mg    Orders Placed This Encounter  Procedures   Radiofrequency,Lumbar   XR C-ARM NO REPORT    Follow-up: Return for visit to requesting provider as needed.   Procedures: No procedures performed  Lumbar Facet Joint Nerve Denervation  Patient: Amanda Blackwell      Date of Birth: 12-25-1975 MRN:  [id number] PCP: Assunta Found, MD      Visit Date: 11/08/2022   Universal Protocol:    Date/Time: 10/27/246:43 PM  Consent Given By: the patient  Position: PRONE  Additional Comments: Vital signs were monitored before and after the procedure. Patient was prepped and draped in the usual sterile fashion. The correct patient, procedure, and site was verified.   Injection Procedure Details:   Procedure diagnoses:  1. Spondylosis without myelopathy or radiculopathy, lumbar region      Meds Administered:  Meds ordered this encounter  Medications   methylPREDNISolone acetate (DEPO-MEDROL) injection 40 mg     Laterality: Right  Location/Site:  L4-L5, L3 and L4 medial branches and L5-S1, L4 medial branch and L5 dorsal ramus  Needle: 18 ga.,  10mm active tip, RF Cannula  Needle Placement: Along juncture of superior articular process and transverse pocess  Findings:  -Comments:  Procedure Details: For each desired target nerve, the corresponding transverse process (sacral ala for the L5 dorsal rami) was identified and the fluoroscope was positioned to square off the endplates of the corresponding vertebral body to achieve a true AP midline view.  The beam was then obliqued 15 to 20 degrees and caudally tilted 15 to 20 degrees to line up a trajectory along the target nerves. The skin over the target of the junction of superior articulating process and transverse process (sacral ala for the L5 dorsal rami) was infiltrated with 1ml of 1% Lidocaine without Epinephrine.  The 18 gauge 10mm active tip  outer cannula was advanced in trajectory view to the target.  This procedure was repeated for each target nerve.  Then, for all levels, the outer cannula placement was fine-tuned and the position was then confirmed with bi-planar imaging.    Test stimulation was done both at sensory and motor levels to ensure there was no radicular stimulation. The target tissues were then infiltrated  with 1 ml of 1% Lidocaine without Epinephrine. Subsequently, a percutaneous neurotomy was carried out for 90 seconds at 80 degrees Celsius.  After the completion of the lesion, 1 ml of injectate was delivered. It was then repeated for each facet joint nerve mentioned above. Appropriate radiographs were obtained to verify the probe placement during the neurotomy.   Additional Comments:  No complications occurred Dressing: 2 x 2 sterile gauze and Band-Aid    Post-procedure details: Patient was observed during the procedure. Post-procedure instructions were reviewed.  Patient left the clinic in stable condition.      Clinical History: MRI LUMBAR SPINE WITHOUT CONTRAST   TECHNIQUE: Multiplanar, multisequence MR imaging of the lumbar spine was performed. No intravenous contrast was administered.   COMPARISON:  None Available.   FINDINGS: Segmentation:  Standard.   Alignment:  Physiologic.   Vertebrae:  No fracture, evidence of discitis, or bone lesion.   Conus medullaris and cauda equina: Conus extends to the L2 level. Conus and cauda equina appear normal.   Paraspinal and other soft tissues: Negative.   Disc levels:   T12-L1: Mild bilateral facet degenerative change. Significant disc bulge. No spinal canal narrowing. No neural foraminal narrowing.   L1-L2: Mild bilateral facet degenerative change with fluid in the facet joint on the left. No significant disc bulge. No spinal canal narrowing. No neural foraminal narrowing.   L2-L3: Mild bilateral facet degenerative change. No significant disc bulge. No spinal canal narrowing. No neural foraminal narrowing.   L3-L4: Mild bilateral facet degenerative change. No significant disc bulge. No spinal canal narrowing. No neural foraminal narrowing.   L4-L5: Moderate to severe left and moderate right facet degenerative change. No significant disc bulge. Mild-to-moderate left and mild right neural foraminal narrowing. No spinal  canal narrowing.   L5-S1: Moderate to severe bilateral facet degenerative change. No spinal canal narrowing. Mild bilateral neural foraminal narrowing.   IMPRESSION: 1. Lower lumbar spine predominant degenerative changes with moderate to severe facet degenerative changes at L4-L5 and L5-S1 with resulting mild neuroforaminal narrowing, as above. 2. No spinal canal narrowing.     Electronically Signed   By: Lorenza Cambridge M.D.   On: 07/07/2022 07:41     Objective:  VS:  HT:    WT:   BMI:     BP:135/84  HR:(!) 102bpm  TEMP: ( )  RESP:  Physical Exam Vitals and nursing note reviewed.  Constitutional:      General: She is not in acute distress.    Appearance: Normal appearance. She is obese. She is not ill-appearing.  HENT:     Head: Normocephalic and atraumatic.     Right Ear: External ear normal.     Left Ear: External ear normal.  Eyes:     Extraocular Movements: Extraocular movements intact.  Cardiovascular:     Rate and Rhythm: Normal rate.     Pulses: Normal pulses.  Pulmonary:     Effort: Pulmonary effort is normal. No respiratory distress.  Abdominal:     General: There is no distension.     Palpations: Abdomen is soft.  Musculoskeletal:  General: Tenderness present.     Cervical back: Neck supple.     Right lower leg: No edema.     Left lower leg: No edema.     Comments: Patient has good distal strength with no pain over the greater trochanters.  No clonus or focal weakness.  Skin:    Findings: No erythema, lesion or rash.  Neurological:     General: No focal deficit present.     Mental Status: She is alert and oriented to person, place, and time.     Sensory: No sensory deficit.     Motor: No weakness or abnormal muscle tone.     Coordination: Coordination normal.  Psychiatric:        Mood and Affect: Mood normal.        Behavior: Behavior normal.      Imaging: No results found.

## 2022-11-21 NOTE — Procedures (Signed)
Lumbar Facet Joint Nerve Denervation  Patient: Amanda Blackwell      Date of Birth: 1975/10/06 MRN: [id number] PCP: Assunta Found, MD      Visit Date: 11/08/2022   Universal Protocol:    Date/Time: 10/27/246:43 PM  Consent Given By: the patient  Position: PRONE  Additional Comments: Vital signs were monitored before and after the procedure. Patient was prepped and draped in the usual sterile fashion. The correct patient, procedure, and site was verified.   Injection Procedure Details:   Procedure diagnoses:  1. Spondylosis without myelopathy or radiculopathy, lumbar region      Meds Administered:  Meds ordered this encounter  Medications   methylPREDNISolone acetate (DEPO-MEDROL) injection 40 mg     Laterality: Right  Location/Site:  L4-L5, L3 and L4 medial branches and L5-S1, L4 medial branch and L5 dorsal ramus  Needle: 18 ga.,  10mm active tip, RF Cannula  Needle Placement: Along juncture of superior articular process and transverse pocess  Findings:  -Comments:  Procedure Details: For each desired target nerve, the corresponding transverse process (sacral ala for the L5 dorsal rami) was identified and the fluoroscope was positioned to square off the endplates of the corresponding vertebral body to achieve a true AP midline view.  The beam was then obliqued 15 to 20 degrees and caudally tilted 15 to 20 degrees to line up a trajectory along the target nerves. The skin over the target of the junction of superior articulating process and transverse process (sacral ala for the L5 dorsal rami) was infiltrated with 1ml of 1% Lidocaine without Epinephrine.  The 18 gauge 10mm active tip outer cannula was advanced in trajectory view to the target.  This procedure was repeated for each target nerve.  Then, for all levels, the outer cannula placement was fine-tuned and the position was then confirmed with bi-planar imaging.    Test stimulation was done both at sensory and  motor levels to ensure there was no radicular stimulation. The target tissues were then infiltrated with 1 ml of 1% Lidocaine without Epinephrine. Subsequently, a percutaneous neurotomy was carried out for 90 seconds at 80 degrees Celsius.  After the completion of the lesion, 1 ml of injectate was delivered. It was then repeated for each facet joint nerve mentioned above. Appropriate radiographs were obtained to verify the probe placement during the neurotomy.   Additional Comments:  No complications occurred Dressing: 2 x 2 sterile gauze and Band-Aid    Post-procedure details: Patient was observed during the procedure. Post-procedure instructions were reviewed.  Patient left the clinic in stable condition.

## 2022-11-22 ENCOUNTER — Ambulatory Visit
Admission: EM | Admit: 2022-11-22 | Discharge: 2022-11-22 | Disposition: A | Discharge status: Home / Self Care | Payer: 59 | Attending: Family Medicine | Admitting: Family Medicine

## 2022-11-22 DIAGNOSIS — N390 Urinary tract infection, site not specified: Secondary | ICD-10-CM | POA: Diagnosis not present

## 2022-11-22 LAB — POCT URINALYSIS DIP (MANUAL ENTRY)
Bilirubin, UA: NEGATIVE
Blood, UA: NEGATIVE
Glucose, UA: NEGATIVE mg/dL
Ketones, POC UA: NEGATIVE mg/dL
Nitrite, UA: POSITIVE — AB
Protein Ur, POC: NEGATIVE mg/dL
Spec Grav, UA: 1.01 (ref 1.010–1.025)
Urobilinogen, UA: 0.2 U/dL
pH, UA: 6.5 (ref 5.0–8.0)

## 2022-11-22 MED ORDER — SULFAMETHOXAZOLE-TRIMETHOPRIM 800-160 MG PO TABS: 1.0000 | ORAL_TABLET | Freq: Two times a day (BID) | ORAL | 0 refills | Status: AC

## 2022-11-22 NOTE — ED Triage Notes (Signed)
Urinary frequency, burning with urination, lower back pain that started 4 days ago. Pt stated she had left over amoxicillin and got prescribed pyridium from obgyn and has been taking that and it did help for 3 days then yesterday she started to feel worse.

## 2022-11-23 NOTE — ED Provider Notes (Signed)
[facility]    CSN: [id number] Arrival date & time: 11/22/22  1522      History   Chief Complaint Chief Complaint  Patient presents with   Urinary Frequency    HPI Amanda Blackwell is a 47 y.o. female.   Patient presenting today with 4-day history of urinary frequency, dysuria, lower back aching.  Denies fever, chills, nausea, vomiting, hematuria, vaginal symptoms.  Took some leftover amoxicillin for the past few days and has some Pyridium from her OB/GYN, notes mild relief with these but symptoms are now worsening again.  No concern for STIs or pregnancy today.    Past Medical History:  Diagnosis Date   Anxiety    Asthma    with seasonal changes   Contraceptive management 09/11/2013   Depression    Deviated septum 06/2012   Family history of colon cancer in mother 01/03/2015   Frequent urination    GERD (gastroesophageal reflux disease)    Headache(784.0)    sinus   IC (interstitial cystitis) 01/31/2015   Nasal turbinate hypertrophy 06/2012   bilateral   OAB (overactive bladder) 02/16/2013   Trial myrbetriq   PONV (postoperative nausea and vomiting)    Seasonal allergies    Stomach pain 01/03/2015   Unspecified symptom associated with female genital organs 02/14/2013   Vaginal discharge 02/14/2013    Patient Active Problem List   Diagnosis Date Noted   Constipation 04/08/2020   Special screening for malignant neoplasms, colon 08/09/2017   Family hx of colon cancer 08/09/2017   IC (interstitial cystitis) 01/31/2015   Stomach pain 01/03/2015   Family history of colon cancer in mother 01/03/2015   Anxiety 09/11/2013   Contraceptive management 09/11/2013   OAB (overactive bladder) 02/16/2013   Female genital symptoms 02/14/2013   Vaginal discharge 02/14/2013    Past Surgical History:  Procedure Laterality Date   CESAREAN SECTION  10/20/2006   CHOLECYSTECTOMY  07/14/2005   laparoscopic   COLONOSCOPY WITH PROPOFOL N/A 05/02/2020   Procedure:  COLONOSCOPY WITH PROPOFOL;  Surgeon: Lanelle Bal, DO;  Location: [facility];  Service: Endoscopy;  Laterality: N/A;  AM, pt tested + 02/29/20, results emailed to [facility]   CYST EXCISION Left 08/14/2001   postauricular   dilation and currettage     NASAL SEPTOPLASTY W/ TURBINOPLASTY Bilateral 07/24/2012   Procedure: NASAL SEPTOPLASTY WITH BILATERAL TURBINATE RESECTION;  Surgeon: Darletta Moll, MD;  Location: [facility];  Service: ENT;  Laterality: Bilateral;   POLYPECTOMY  05/02/2020   Procedure: POLYPECTOMY;  Surgeon: Lanelle Bal, DO;  Location: [facility];  Service: Endoscopy;;  cecal polyp    OB History     Gravida  2   Para  1   Term  1   Preterm      AB  1   Living  1      SAB  1   IAB      Ectopic      Multiple      Live Births  1            Home Medications    Prior to Admission medications   Medication Sig Start Date End Date Taking? Authorizing Provider  ALPRAZolam Prudy Feeler) 0.5 MG tablet Take 1 tablet (0.5 mg total) by mouth at bedtime as needed. 11/09/22  Yes Lazaro Arms, MD  Ascorbic Acid (VITAMIN C) 500 MG CHEW Chew 500 mg by mouth at bedtime.   Yes [provider]  buPROPion (WELLBUTRIN XL) 150 MG 24 hr tablet TAKE 3 TABLETS (450 MG) EVERY MORNING 05/31/22  Yes Cyril Mourning A, NP  cetirizine (ZYRTEC) 10 MG tablet Take 10 mg by mouth daily.   Yes [provider]  cyclobenzaprine (FLEXERIL) 10 MG tablet Take 1 tablet (10 mg total) by mouth 2 (two) times daily as needed for muscle spasms. Do not take with alcohol or while driving or operating heavy machinery.  May cause drowsiness. 08/17/22  Yes Darreld Mclean, MD  diclofenac (VOLTAREN) 75 MG EC tablet Take 1 tablet (75 mg total) by mouth 2 (two) times daily. 05/30/22  Yes Raspet, Noberto Retort, PA-C  ELMIRON 100 MG capsule TAKE 2 CAPSULES BY MOUTH TWICE A DAY 09/06/22  Yes Cyril Mourning A, NP  fluticasone (FLONASE) 50 MCG/ACT nasal spray Place 1 spray into both  nostrils daily as needed for allergies or rhinitis.   Yes [provider]  lidocaine (LIDODERM) 5 % Place 1 patch onto the skin daily. Remove & Discard patch within 12 hours or as directed by MD 08/17/22  Yes Darreld Mclean, MD  pantoprazole (PROTONIX) 40 MG tablet Take 40 mg by mouth daily. 05/15/21  Yes [provider]  phenazopyridine (PYRIDIUM) 200 MG tablet Take 1 tablet (200 mg total) by mouth 3 (three) times daily as needed for pain. 11/19/22  Yes Lazaro Arms, MD  rosuvastatin (CRESTOR) 10 MG tablet Take 10 mg by mouth every Monday, Wednesday, and Friday. 12/12/19  Yes [provider]  sulfamethoxazole-trimethoprim (BACTRIM DS) 800-160 MG tablet Take 1 tablet by mouth 2 (two) times daily for 3 days. 11/22/22 11/25/22 Yes Particia Nearing, PA-C  VELIVET 0.1/0.125/0.15 -0.025 MG tablet TAKE 1 TABLET BY MOUTH EVERY DAY 11/01/22  Yes Cyril Mourning A, NP  VITAMIN D PO Take 10,000 Units by mouth.   Yes [provider]  diazepam (VALIUM) 5 MG tablet Take one tablet every six hours as needed for back spasm. 06/29/22   Darreld Mclean, MD  Nystatin (GERHARDT'S BUTT CREAM) CREA Apply 1 application. topically 2 (two) times daily. 05/19/21   Lazaro Arms, MD  PROAIR HFA 108 [phone] Base) MCG/ACT inhaler Inhale 2 puffs into the lungs every 4 (four) hours as needed for shortness of breath or wheezing. 05/19/22   Lazaro Arms, MD  Pseudoephedrine-Ibuprofen (ADVIL COLD/SINUS PO) Take 1 tablet by mouth daily as needed (sinus pressure).    [provider]  sucralfate (CARAFATE) 1 g tablet Take 1 tablet (1 g total) by mouth 3 (three) times daily as needed. Dissolve 1 tablet into a glass of water and drink as needed 11/24/20   Particia Nearing, PA-C  tolterodine (DETROL LA) 4 MG 24 hr capsule TAKE 1 CAPSULE BY MOUTH EVERY DAY 12/17/20   Cheral Marker, CNM    Family History Family History  Problem Relation Age of Onset   Anesthesia problems Maternal  Grandmother        hard to wake up post-op   Cancer Maternal Grandmother        cervical   Congestive Heart Failure Maternal Grandmother    Cancer Mother 68       colon   Diabetes Mother    Parkinson's disease Father    ADD / ADHD Daughter    Diabetes Maternal Aunt    Cancer Paternal Grandmother        breast   Breast cancer Paternal Grandmother        ? age of onset   Cancer  Paternal Grandfather        lung    Social History Social History   Tobacco Use   Smoking status: Every Day    Types: E-cigarettes    Last attempt to quit: 11/26/2010    Years since quitting: 12.0   Smokeless tobacco: Never   Tobacco comments:    vapes  Vaping Use   Vaping status: Every Day  Substance Use Topics   Alcohol use: Yes    Comment: occ   Drug use: No     Allergies   Hydrocodone and Latex   Review of Systems Review of Systems Per HPI  Physical Exam Triage Vital Signs ED Triage Vitals [11/22/22 1537]  Encounter Vitals Group     BP (!) 140/78     Systolic BP Percentile      Diastolic BP Percentile      Pulse Rate 95     Resp 16     Temp 98.7 F (37.1 C)     Temp Source Oral     SpO2 94 %     Weight      Height      Head Circumference      Peak Flow      Pain Score 3     Pain Loc      Pain Education      Exclude from Growth Chart    No data found.  Updated Vital Signs BP (!) 140/78 (BP Location: Right Arm)   Pulse 95   Temp 98.7 F (37.1 C) (Oral)   Resp 16   LMP  (LMP Unknown) Comment: irregular periods  SpO2 94%   Visual Acuity Right Eye Distance:   Left Eye Distance:   Bilateral Distance:    Right Eye Near:   Left Eye Near:    Bilateral Near:     Physical Exam Vitals and nursing note reviewed.  Constitutional:      Appearance: Normal appearance. She is not ill-appearing.  HENT:     Head: Atraumatic.  Eyes:     Extraocular Movements: Extraocular movements intact.     Conjunctiva/sclera: Conjunctivae normal.  Cardiovascular:     Rate and  Rhythm: Normal rate and regular rhythm.     Heart sounds: Normal heart sounds.  Pulmonary:     Effort: Pulmonary effort is normal.     Breath sounds: Normal breath sounds.  Abdominal:     General: Bowel sounds are normal. There is no distension.     Palpations: Abdomen is soft.     Tenderness: There is no abdominal tenderness. There is no right CVA tenderness, left CVA tenderness or guarding.  Musculoskeletal:        General: Normal range of motion.     Cervical back: Normal range of motion and neck supple.  Skin:    General: Skin is warm and dry.  Neurological:     Mental Status: She is alert and oriented to person, place, and time.  Psychiatric:        Mood and Affect: Mood normal.        Thought Content: Thought content normal.        Judgment: Judgment normal.      UC Treatments / Results  Labs (all labs ordered are listed, but only abnormal results are displayed) Labs Reviewed  POCT URINALYSIS DIP (MANUAL ENTRY) - Abnormal; Notable for the following components:      Result Value   Nitrite, UA Positive (*)    Leukocytes, UA  Trace (*)    All other components within normal limits  URINE CULTURE    EKG   Radiology No results found.  Procedures Procedures (including critical care time)  Medications Ordered in UC Medications - No data to display  Initial Impression / Assessment and Plan / UC Course  I have reviewed the triage vital signs and the nursing notes.  Pertinent labs & imaging results that were available during my care of the patient were reviewed by me and considered in my medical decision making (see chart for details).     Urinalysis with evidence of a possible urinary tract infection today, discussed with patient that urine culture may not be accurate given her recent antibiotic use but will obtain for reference.  Treat with Bactrim, fluids, Perdiem as needed.  Return for worsening symptoms.  Final Clinical Impressions(s) / UC Diagnoses   Final  diagnoses:  Acute lower UTI   Discharge Instructions   None    ED Prescriptions     Medication Sig Dispense Auth. Provider   sulfamethoxazole-trimethoprim (BACTRIM DS) 800-160 MG tablet Take 1 tablet by mouth 2 (two) times daily for 3 days. 6 tablet Particia Nearing, [place]      PDMP not reviewed this encounter.   Particia Nearing, [place] 11/23/22 1026

## 2022-11-24 LAB — URINE CULTURE: Culture: 10000 — AB

## 2022-11-29 ENCOUNTER — Ambulatory Visit: Payer: 59

## 2022-12-06 ENCOUNTER — Ambulatory Visit: Payer: 59 | Admitting: Obstetrics & Gynecology

## 2022-12-06 ENCOUNTER — Encounter: Payer: Self-pay | Admitting: Obstetrics & Gynecology

## 2022-12-06 VITALS — BP 157/99 | HR 97 | Ht 64.0 in | Wt 233.5 lb

## 2022-12-06 DIAGNOSIS — N3010 Interstitial cystitis (chronic) without hematuria: Secondary | ICD-10-CM

## 2022-12-06 NOTE — Progress Notes (Unsigned)
Diagnosed with IC: 2018  Current Meds:  Elmiron, DMSO Dietary restrictions    Pt states her symptoms have been stable, no exacerbations, although not perfect Wants to continue on this cycle  Blood pressure (!) 145/86, pulse 98, height 5\' 4"  (1.626 m), weight 233 lb 8 oz (105.9 kg), last menstrual period 12/05/2022.    The external urethra meatus was prepped with betadine DMSO 50 cc was instilled in the usual fashion after the bladder was catheterized and emptied completely 50cc was instilled into the bladder without difficulty and the patient tolerated well She will refrain from voiding as long as possible  Follow up in 8 weeks, or as patient requests based on her symptom complex

## 2022-12-14 ENCOUNTER — Ambulatory Visit
Admission: RE | Admit: 2022-12-14 | Discharge: 2022-12-14 | Disposition: A | Discharge status: Home / Self Care | Payer: 59 | Source: Ambulatory Visit | Attending: Obstetrics & Gynecology | Admitting: Obstetrics & Gynecology

## 2022-12-14 DIAGNOSIS — Z1231 Encounter for screening mammogram for malignant neoplasm of breast: Secondary | ICD-10-CM

## 2023-01-30 ENCOUNTER — Other Ambulatory Visit: Payer: Self-pay | Admitting: Adult Health

## 2023-02-03 ENCOUNTER — Ambulatory Visit: Payer: 59 | Admitting: Obstetrics & Gynecology

## 2023-02-14 ENCOUNTER — Ambulatory Visit: Payer: 59 | Admitting: Obstetrics & Gynecology

## 2023-02-14 ENCOUNTER — Encounter: Payer: 59 | Admitting: Obstetrics & Gynecology

## 2023-02-14 ENCOUNTER — Encounter: Payer: Self-pay | Admitting: Obstetrics & Gynecology

## 2023-02-14 VITALS — BP 159/99 | HR 100 | Ht 64.0 in | Wt 238.0 lb

## 2023-02-14 DIAGNOSIS — N3281 Overactive bladder: Secondary | ICD-10-CM

## 2023-02-14 DIAGNOSIS — N3010 Interstitial cystitis (chronic) without hematuria: Secondary | ICD-10-CM | POA: Diagnosis not present

## 2023-02-14 NOTE — Progress Notes (Signed)
Diagnosed with IC: 2018  Current Meds:  Elmiron + DMSO Dietary restrictions    Pt states her symptoms have been stable, no exacerbations, although not perfect Wants to continue on this cycle  Blood pressure (!) 153/95, pulse 99, height 5\' 4"  (1.626 m), weight 238 lb (108 kg).    The external urethra meatus was prepped with betadine DMSO 50 cc was instilled in the usual fashion after the bladder was catheterized and emptied completely 50cc was instilled into the bladder without difficulty and the patient tolerated well She will refrain from voiding as long as possible  Follow up in 8 weeks, or as patient requests based on her symptom complex

## 2023-02-18 NOTE — Progress Notes (Signed)
This encounter was created in error - please disregard.

## 2023-02-28 ENCOUNTER — Ambulatory Visit
Admission: RE | Admit: 2023-02-28 | Discharge: 2023-02-28 | Disposition: A | Discharge status: Home / Self Care | Payer: 59 | Source: Ambulatory Visit | Attending: Physician Assistant | Admitting: Physician Assistant

## 2023-02-28 VITALS — BP 139/94 | HR 95 | Temp 98.6°F | Resp 16

## 2023-02-28 DIAGNOSIS — M25561 Pain in right knee: Secondary | ICD-10-CM | POA: Diagnosis not present

## 2023-02-28 MED ORDER — MELOXICAM 15 MG PO TABS: 15.0000 mg | ORAL_TABLET | Freq: Every day | ORAL | 0 refills | Status: DC

## 2023-02-28 NOTE — ED Triage Notes (Addendum)
Pt reports right leg pain swelling and foot numbness. Sx's started Saturday evening. Pain is from knee down to foot.

## 2023-02-28 NOTE — ED Provider Notes (Signed)
[facility]    CSN: [id number] Arrival date & time: 02/28/23  1601      History   Chief Complaint Chief Complaint  Patient presents with   Leg Pain    I am having leg pain, foot pain and my foot feels numb occasionally the last couple of days. - Entered by patient    HPI Martisha Toulouse Skillin is a 48 y.o. female.   Patient presents today with a prolonged history of right knee and leg pain that has worsened over the past week.  She denies any known injury increase in activity prior to symptom onset.  Denies any recent traumas or fall.  Reports that she did recently change her job duties and she is no longer walking as frequently and now sits in a forklift.  She reports that the pain is rated 1/2 at rest but increases with attempted ambulation, described as aching with periodic sharp pains, localized to her inferior knee with radiation through her leg into her ankle and foot.  She did have an episode of numbness and paresthesias a few days ago that resolved with rest and elevation of her feet.  She does have a history of osteoarthritis of her back but denies any previous episodes of sciatica.  She has been followed by orthopedics but not seeing them for her knee issue denies any previous arthroplasty involving the right knee.  She is no longer experiencing any numbness or paresthesias.  Denies any recent immobilization, hospitalization, active malignancy, history of VTE event.  She has tried Tylenol and Aleve with temporary improvement of symptoms.  She has no concern for pregnancy.    Past Medical History:  Diagnosis Date   Anxiety    Asthma    with seasonal changes   Contraceptive management 09/11/2013   Depression    Deviated septum 06/2012   Family history of colon cancer in mother 01/03/2015   Frequent urination    GERD (gastroesophageal reflux disease)    Headache(784.0)    sinus   IC (interstitial cystitis) 01/31/2015   Nasal turbinate hypertrophy 06/2012   bilateral    OAB (overactive bladder) 02/16/2013   Trial myrbetriq   PONV (postoperative nausea and vomiting)    Seasonal allergies    Stomach pain 01/03/2015   Unspecified symptom associated with female genital organs 02/14/2013   Vaginal discharge 02/14/2013    Patient Active Problem List   Diagnosis Date Noted   Constipation 04/08/2020   Special screening for malignant neoplasms, colon 08/09/2017   Family hx of colon cancer 08/09/2017   IC (interstitial cystitis) 01/31/2015   Stomach pain 01/03/2015   Family history of colon cancer in mother 01/03/2015   Anxiety 09/11/2013   Contraceptive management 09/11/2013   OAB (overactive bladder) 02/16/2013   Female genital symptoms 02/14/2013   Vaginal discharge 02/14/2013    Past Surgical History:  Procedure Laterality Date   CESAREAN SECTION  10/20/2006   CHOLECYSTECTOMY  07/14/2005   laparoscopic   COLONOSCOPY WITH PROPOFOL N/A 05/02/2020   Procedure: COLONOSCOPY WITH PROPOFOL;  Surgeon: Lanelle Bal, DO;  Location: [facility];  Service: Endoscopy;  Laterality: N/A;  AM, pt tested + 02/29/20, results emailed to [facility]   CYST EXCISION Left 08/14/2001   postauricular   dilation and currettage     NASAL SEPTOPLASTY W/ TURBINOPLASTY Bilateral 07/24/2012   Procedure: NASAL SEPTOPLASTY WITH BILATERAL TURBINATE RESECTION;  Surgeon: Darletta Moll, MD;  Location: [facility];  Service: ENT;  Laterality: Bilateral;  POLYPECTOMY  05/02/2020   Procedure: POLYPECTOMY;  Surgeon: Lanelle Bal, DO;  Location: [facility];  Service: Endoscopy;;  cecal polyp    OB History     Gravida  2   Para  1   Term  1   Preterm      AB  1   Living  1      SAB  1   IAB      Ectopic      Multiple      Live Births  1            Home Medications    Prior to Admission medications   Medication Sig Start Date End Date Taking? Authorizing Provider  meloxicam (MOBIC) 15 MG tablet Take 1 tablet (15 mg total) by mouth daily.  02/28/23  Yes Dawood Spitler, Noberto Retort, PA-C  ALPRAZolam (XANAX) 0.5 MG tablet Take 1 tablet (0.5 mg total) by mouth at bedtime as needed. 11/09/22   Lazaro Arms, MD  Ascorbic Acid (VITAMIN C) 500 MG CHEW Chew 500 mg by mouth at bedtime.    [provider]  buPROPion (WELLBUTRIN XL) 150 MG 24 hr tablet TAKE 3 TABLETS (450 MG) BY MOUTH EVERY MORNING 01/31/23   Adline Potter, NP  cetirizine (ZYRTEC) 10 MG tablet Take 10 mg by mouth daily.    [provider]  cyclobenzaprine (FLEXERIL) 10 MG tablet Take 1 tablet (10 mg total) by mouth 2 (two) times daily as needed for muscle spasms. Do not take with alcohol or while driving or operating heavy machinery.  May cause drowsiness. 08/17/22   Darreld Mclean, MD  ELMIRON 100 MG capsule TAKE 2 CAPSULES BY MOUTH TWICE A DAY 09/06/22   Adline Potter, NP  fluticasone (FLONASE) 50 MCG/ACT nasal spray Place 1 spray into both nostrils daily as needed for allergies or rhinitis.    [provider]  lidocaine (LIDODERM) 5 % Place 1 patch onto the skin daily. Remove & Discard patch within 12 hours or as directed by MD 08/17/22   Darreld Mclean, MD  Nystatin (GERHARDT'S BUTT CREAM) CREA Apply 1 application. topically 2 (two) times daily. 05/19/21   Lazaro Arms, MD  pantoprazole (PROTONIX) 40 MG tablet Take 40 mg by mouth daily. 05/15/21   [provider]  phenazopyridine (PYRIDIUM) 200 MG tablet Take 1 tablet (200 mg total) by mouth 3 (three) times daily as needed for pain. 11/19/22   Lazaro Arms, MD  PROAIR HFA 108 [phone] Base) MCG/ACT inhaler Inhale 2 puffs into the lungs every 4 (four) hours as needed for shortness of breath or wheezing. 05/19/22   Lazaro Arms, MD  Pseudoephedrine-Ibuprofen (ADVIL COLD/SINUS PO) Take 1 tablet by mouth daily as needed (sinus pressure).    [provider]  rosuvastatin (CRESTOR) 10 MG tablet Take 10 mg by mouth every Monday, Wednesday, and Friday. 12/12/19   [provider]   sucralfate (CARAFATE) 1 g tablet Take 1 tablet (1 g total) by mouth 3 (three) times daily as needed. Dissolve 1 tablet into a glass of water and drink as needed Patient not taking: Reported on 12/06/2022 11/24/20   Particia Nearing, PA-C  tolterodine (DETROL LA) 4 MG 24 hr capsule TAKE 1 CAPSULE BY MOUTH EVERY DAY 12/17/20   Cheral Marker, CNM  VELIVET 0.1/0.125/0.15 -0.025 MG tablet TAKE 1 TABLET BY MOUTH EVERY DAY 11/01/22   Adline Potter, NP  VITAMIN D PO Take 10,000 Units by mouth.  [provider]    Family History Family History  Problem Relation Age of Onset   Anesthesia problems Maternal Grandmother        hard to wake up post-op   Cancer Maternal Grandmother        cervical   Congestive Heart Failure Maternal Grandmother    Cancer Mother 60       colon   Diabetes Mother    Parkinson's disease Father    ADD / ADHD Daughter    Diabetes Maternal Aunt    Cancer Paternal Grandmother        breast   Breast cancer Paternal Grandmother        ? age of onset   Cancer Paternal Grandfather        lung    Social History Social History   Tobacco Use   Smoking status: Every Day    Types: E-cigarettes    Last attempt to quit: 11/26/2010    Years since quitting: 12.2   Smokeless tobacco: Never   Tobacco comments:    vapes  Vaping Use   Vaping status: Every Day  Substance Use Topics   Alcohol use: Yes    Comment: occ   Drug use: No     Allergies   Hydrocodone and Latex   Review of Systems Review of Systems  Constitutional:  Positive for activity change. Negative for appetite change, fatigue and fever.  Respiratory:  Negative for shortness of breath.   Cardiovascular:  Negative for chest pain, palpitations and leg swelling.  Gastrointestinal:  Negative for abdominal pain, diarrhea, nausea and vomiting.  Musculoskeletal:  Positive for arthralgias and gait problem. Negative for joint swelling and myalgias.  Neurological:  Negative for  weakness and numbness.     Physical Exam Triage Vital Signs ED Triage Vitals  Encounter Vitals Group     BP 02/28/23 1616 (!) 139/94     Systolic BP Percentile --      Diastolic BP Percentile --      Pulse Rate 02/28/23 1616 95     Resp 02/28/23 1616 16     Temp 02/28/23 1616 98.6 F (37 C)     Temp Source 02/28/23 1616 Oral     SpO2 02/28/23 1616 96 %     Weight --      Height --      Head Circumference --      Peak Flow --      Pain Score 02/28/23 1619 2     Pain Loc --      Pain Education --      Exclude from Growth Chart --    No data found.  Updated Vital Signs BP (!) 139/94 (BP Location: Right Arm)   Pulse 95   Temp 98.6 F (37 C) (Oral)   Resp 16   SpO2 96%   Visual Acuity Right Eye Distance:   Left Eye Distance:   Bilateral Distance:    Right Eye Near:   Left Eye Near:    Bilateral Near:     Physical Exam Vitals reviewed.  Constitutional:      General: She is awake. She is not in acute distress.    Appearance: Normal appearance. She is well-developed. She is not ill-appearing.     Comments: Very pleasant female appears stated age in no acute distress sitting comfortably in exam room  HENT:     Head: Normocephalic and atraumatic.  Cardiovascular:     Rate and Rhythm: Normal rate  and regular rhythm.     Pulses:          Posterior tibial pulses are 2+ on the right side.     Heart sounds: Normal heart sounds, S1 normal and S2 normal. No murmur heard.    Comments: Negative Denna Haggard' sign on right.  Calves measure 48 cm bilaterally. Pulmonary:     Effort: Pulmonary effort is normal.     Breath sounds: Normal breath sounds. No wheezing, rhonchi or rales.     Comments: Clear to auscultation bilaterally Musculoskeletal:     Right knee: No swelling. Tenderness present. No medial joint line or lateral joint line tenderness. No LCL laxity, MCL laxity, ACL laxity or PCL laxity.     Right lower leg: No edema.     Left lower leg: No edema.     Comments: Right  knee: No ligamentous laxity on exam.  Foot is neurovascularly intact.  Mild tenderness palpation over inferior joint line.  No deformity noted.  Strength 5/5.  Skin:    Findings: No erythema.  Psychiatric:        Behavior: Behavior is cooperative.      UC Treatments / Results  Labs (all labs ordered are listed, but only abnormal results are displayed) Labs Reviewed - No data to display  EKG   Radiology No results found.  Procedures Procedures (including critical care time)  Medications Ordered in UC Medications - No data to display  Initial Impression / Assessment and Plan / UC Course  I have reviewed the triage vital signs and the nursing notes.  Pertinent labs & imaging results that were available during my care of the patient were reviewed by me and considered in my medical decision making (see chart for details).     Patient is well-appearing, afebrile, nontoxic, nontachycardic.  Plan labs were deferred as she denies any recent trauma no focal bony tenderness.  We discussed symptoms are most likely related to osteoarthritis and that it would be worthwhile to get an x-ray in the future, however, we do not currently have x-ray in clinic today so we will have her follow-up with orthopedics.  Low suspicion for VTE event given clinical presentation.  She was placed in a brace for comfort and support.  Recommended RICE protocol.  She was started on Mobic daily to help with arthralgias and we discussed that she is not to take NSAIDs with this medication.  She is already established with orthopedics and strongly encouraged to call them to schedule an appointment for further evaluation.  We discussed that if anything worsens and she has increasing pain, redness, swelling, fever, difficulty ambulating, numbness or paresthesias in her foot she should be reevaluated immediately.  Strict return precautions given.  Excuse note provided.  Final Clinical Impressions(s) / UC Diagnoses   Final  diagnoses:  Acute pain of right knee     Discharge Instructions      Use the brace for comfort and support.  When you are at home keep your leg elevated and use ice for 15 minutes at a time 3-4 times per day.  Start meloxicam daily.  Do not take NSAIDs with this medication including aspirin, ibuprofen/Advil, naproxen/Aleve.  You can use acetaminophen/Tylenol.  Follow-up with orthopedics; call to schedule an appointment.  If anything worsens or changes please return for reevaluation.     ED Prescriptions     Medication Sig Dispense Auth. Provider   meloxicam (MOBIC) 15 MG tablet Take 1 tablet (15 mg total) by mouth  daily. 30 tablet Cullin Dishman, Noberto Retort, PA-C      PDMP not reviewed this encounter.   Jeani Hawking, PA-C 02/28/23 1651

## 2023-02-28 NOTE — Discharge Instructions (Signed)
Use the brace for comfort and support.  When you are at home keep your leg elevated and use ice for 15 minutes at a time 3-4 times per day.  Start meloxicam daily.  Do not take NSAIDs with this medication including aspirin, ibuprofen/Advil, naproxen/Aleve.  You can use acetaminophen/Tylenol.  Follow-up with orthopedics; call to schedule an appointment.  If anything worsens or changes please return for reevaluation.

## 2023-03-10 ENCOUNTER — Other Ambulatory Visit (INDEPENDENT_AMBULATORY_CARE_PROVIDER_SITE_OTHER): Payer: 59

## 2023-03-10 ENCOUNTER — Encounter: Payer: Self-pay | Admitting: Orthopaedic Surgery

## 2023-03-10 ENCOUNTER — Ambulatory Visit: Payer: 59 | Admitting: Orthopaedic Surgery

## 2023-03-10 VITALS — BP 140/93 | HR 96 | Ht 64.0 in | Wt 238.0 lb

## 2023-03-10 DIAGNOSIS — M25561 Pain in right knee: Secondary | ICD-10-CM

## 2023-03-10 DIAGNOSIS — G8929 Other chronic pain: Secondary | ICD-10-CM

## 2023-03-10 MED ORDER — METHYLPREDNISOLONE ACETATE 40 MG/ML IJ SUSP
40.0000 mg | Freq: Once | INTRAMUSCULAR | Status: AC
  Administered 2023-03-10: 40 mg via INTRA_ARTICULAR

## 2023-03-10 NOTE — Addendum Note (Signed)
Addended by: Michaele Offer on: 03/10/2023 08:55 AM   Modules accepted: Orders

## 2023-03-10 NOTE — Progress Notes (Signed)
My knee hurts.  She has had pain in the right knee for several weeks.  She has swelling, popping and giving way.  She was seen at the Urgent Care 02-28-23 for this.  She is taking Mobic with no help.  She has used ice and heat with little help. The Urgent Care gave her a brace which helps some.  She has no redness, no trauma, no numbness.  Right knee has slight effusion, crepitus. ROM 0 to 110, positive medial McMurray, limp right, NV intact, no distal edema.  X-rays were done of the right knee, reported separately.  Encounter Diagnosis  Name Primary?   Chronic pain of right knee Yes   PROCEDURE NOTE:  The patient requests injections of the right knee , verbal consent was obtained.  The right knee was prepped appropriately after time out was performed.   Sterile technique was observed and injection of 1 cc of DepoMedrol 40mg  with several cc's of plain xylocaine. Anesthesia was provided by ethyl chloride and a 20-gauge needle was used to inject the knee area. The injection was tolerated well.  A band aid dressing was applied.  The patient was advised to apply ice later today and tomorrow to the injection sight as needed.  Return in three weeks.  Consider MRI if not improved.  Call if any problem.  Precautions discussed.  Electronically Signed Darreld Mclean, MD 2/13/20258:51 AM

## 2023-03-18 ENCOUNTER — Ambulatory Visit: Payer: 59 | Admitting: Obstetrics & Gynecology

## 2023-03-18 ENCOUNTER — Encounter: Payer: Self-pay | Admitting: Obstetrics & Gynecology

## 2023-03-18 VITALS — BP 142/89 | HR 106 | Ht 64.0 in | Wt 234.0 lb

## 2023-03-18 DIAGNOSIS — L089 Local infection of the skin and subcutaneous tissue, unspecified: Secondary | ICD-10-CM

## 2023-03-18 DIAGNOSIS — B9562 Methicillin resistant Staphylococcus aureus infection as the cause of diseases classified elsewhere: Secondary | ICD-10-CM | POA: Diagnosis not present

## 2023-03-18 MED ORDER — SILVER SULFADIAZINE 1 % EX CREA: TOPICAL_CREAM | CUTANEOUS | 11 refills | Status: AC

## 2023-03-18 MED ORDER — SULFAMETHOXAZOLE-TRIMETHOPRIM 800-160 MG PO TABS: 1.0000 | ORAL_TABLET | Freq: Two times a day (BID) | ORAL | 0 refills | Status: DC

## 2023-03-18 NOTE — Progress Notes (Signed)
Chief Complaint  Patient presents with   right breast cyst      48 y.o. G2P1011 No LMP recorded. (Menstrual status: Oral contraceptives). The current method of family planning is none.  Outpatient Encounter Medications as of 03/18/2023  Medication Sig   ALPRAZolam (XANAX) 0.5 MG tablet Take 1 tablet (0.5 mg total) by mouth at bedtime as needed.   Ascorbic Acid (VITAMIN C) 500 MG CHEW Chew 500 mg by mouth at bedtime.   buPROPion (WELLBUTRIN XL) 150 MG 24 hr tablet TAKE 3 TABLETS (450 MG) BY MOUTH EVERY MORNING   cetirizine (ZYRTEC) 10 MG tablet Take 10 mg by mouth daily.   cyclobenzaprine (FLEXERIL) 10 MG tablet Take 1 tablet (10 mg total) by mouth 2 (two) times daily as needed for muscle spasms. Do not take with alcohol or while driving or operating heavy machinery.  May cause drowsiness.   ELMIRON 100 MG capsule TAKE 2 CAPSULES BY MOUTH TWICE A DAY   fluticasone (FLONASE) 50 MCG/ACT nasal spray Place 1 spray into both nostrils daily as needed for allergies or rhinitis.   meloxicam (MOBIC) 15 MG tablet Take 1 tablet (15 mg total) by mouth daily.   pantoprazole (PROTONIX) 40 MG tablet Take 40 mg by mouth daily.   PROAIR HFA 108 (90 Base) MCG/ACT inhaler Inhale 2 puffs into the lungs every 4 (four) hours as needed for shortness of breath or wheezing.   rosuvastatin (CRESTOR) 10 MG tablet Take 10 mg by mouth every Monday, Wednesday, and Friday.   tolterodine (DETROL LA) 4 MG 24 hr capsule TAKE 1 CAPSULE BY MOUTH EVERY DAY   VELIVET 0.1/0.125/0.15 -0.025 MG tablet TAKE 1 TABLET BY MOUTH EVERY DAY   VITAMIN D PO Take 10,000 Units by mouth.   lidocaine (LIDODERM) 5 % Place 1 patch onto the skin daily. Remove & Discard patch within 12 hours or as directed by MD (Patient not taking: Reported on 03/18/2023)   Nystatin (GERHARDT'S BUTT CREAM) CREA Apply 1 application. topically 2 (two) times daily. (Patient not taking: Reported on 03/18/2023)   phenazopyridine (PYRIDIUM) 200 MG tablet Take 1  tablet (200 mg total) by mouth 3 (three) times daily as needed for pain. (Patient not taking: Reported on 03/18/2023)   Pseudoephedrine-Ibuprofen (ADVIL COLD/SINUS PO) Take 1 tablet by mouth daily as needed (sinus pressure). (Patient not taking: Reported on 03/18/2023)   sucralfate (CARAFATE) 1 g tablet Take 1 tablet (1 g total) by mouth 3 (three) times daily as needed. Dissolve 1 tablet into a glass of water and drink as needed (Patient not taking: Reported on 03/18/2023)   No facility-administered encounter medications on file as of 03/18/2023.    Subjective Pt with recurrent area under the right breast, where her bra strap is irritating it appears Had it 2-3 years ago It is not the breast tissue, just skin just under the breast fold  On exam likely MRSA associated and will treat as such Past Medical History:  Diagnosis Date   Anxiety    Asthma    with seasonal changes   Depression    Deviated septum 06/2012   Family history of colon cancer in mother 01/03/2015   Frequent urination    GERD (gastroesophageal reflux disease)    Headache(784.0)    sinus   IC (interstitial cystitis) 01/31/2015   Nasal turbinate hypertrophy 06/2012   bilateral   OAB (overactive bladder) 02/16/2013   Trial myrbetriq   PONV (postoperative nausea and vomiting)    Seasonal  allergies    Stomach pain 01/03/2015   Unspecified symptom associated with female genital organs 02/14/2013    Past Surgical History:  Procedure Laterality Date   CESAREAN SECTION  10/20/2006   CHOLECYSTECTOMY  07/14/2005   laparoscopic   COLONOSCOPY WITH PROPOFOL N/A 05/02/2020   Procedure: COLONOSCOPY WITH PROPOFOL;  Surgeon: Lanelle Bal, DO;  Location: [facility];  Service: Endoscopy;  Laterality: N/A;  AM, pt tested + 02/29/20, results emailed to [facility]   CYST EXCISION Left 08/14/2001   postauricular   dilation and currettage     NASAL SEPTOPLASTY W/ TURBINOPLASTY Bilateral 07/24/2012   Procedure: NASAL SEPTOPLASTY WITH  BILATERAL TURBINATE RESECTION;  Surgeon: Darletta Moll, MD;  Location: [facility];  Service: ENT;  Laterality: Bilateral;   POLYPECTOMY  05/02/2020   Procedure: POLYPECTOMY;  Surgeon: Lanelle Bal, DO;  Location: [facility];  Service: Endoscopy;;  cecal polyp    OB History     Gravida  2   Para  1   Term  1   Preterm      AB  1   Living  1      SAB  1   IAB      Ectopic      Multiple      Live Births  1           Allergies  Allergen Reactions   Hydrocodone Itching   Latex Rash    Social History   Socioeconomic History   Marital status: Married    Spouse name: Not on file   Number of children: Not on file   Years of education: Not on file   Highest education level: Not on file  Occupational History   Not on file  Tobacco Use   Smoking status: Every Day    Types: E-cigarettes    Last attempt to quit: 11/26/2010    Years since quitting: 12.3   Smokeless tobacco: Never   Tobacco comments:    vapes  Vaping Use   Vaping status: Every Day  Substance and Sexual Activity   Alcohol use: Yes    Comment: occ   Drug use: No   Sexual activity: Yes    Birth control/protection: Pill  Other Topics Concern   Not on file  Social History Narrative   Not on file   Social Drivers of Health   Financial Resource Strain: Low Risk  (12/10/2021)   Overall Financial Resource Strain (CARDIA)    Difficulty of Paying Living Expenses: Not hard at all  Food Insecurity: No Food Insecurity (12/10/2021)   Hunger Vital Sign    Worried About Running Out of Food in the Last Year: Never true    Ran Out of Food in the Last Year: Never true  Transportation Needs: No Transportation Needs (12/10/2021)   PRAPARE - Administrator, Civil Service (Medical): No    Lack of Transportation (Non-Medical): No  Physical Activity: Insufficiently Active (12/10/2021)   Exercise Vital Sign    Days of Exercise per Week: 2 days    Minutes of Exercise per  Session: 30 min  Stress: Stress Concern Present (12/10/2021)   [employer] of Occupational Health - Occupational Stress Questionnaire    Feeling of Stress : To some extent  Social Connections: Moderately Integrated (12/10/2021)   Social Connection and Isolation Panel [NHANES]    Frequency of Communication with Friends and Family: More than three times a week    Frequency  of Social Gatherings with Friends and Family: Three times a week    Attends Religious Services: 1 to 4 times per year    Active Member of Clubs or Organizations: No    Attends Engineer, structural: Never    Marital Status: Married    Family History  Problem Relation Age of Onset   Anesthesia problems Maternal Grandmother        hard to wake up post-op   Cancer Maternal Grandmother        cervical   Congestive Heart Failure Maternal Grandmother    Cancer Mother 67       colon   Diabetes Mother    Parkinson's disease Father    ADD / ADHD Daughter    Diabetes Maternal Aunt    Cancer Paternal Grandmother        breast   Breast cancer Paternal Grandmother        ? age of onset   Cancer Paternal Grandfather        lung    Medications:       Current Outpatient Medications:    ALPRAZolam (XANAX) 0.5 MG tablet, Take 1 tablet (0.5 mg total) by mouth at bedtime as needed., Disp: 30 tablet, Rfl: 5   Ascorbic Acid (VITAMIN C) 500 MG CHEW, Chew 500 mg by mouth at bedtime., Disp: , Rfl:    buPROPion (WELLBUTRIN XL) 150 MG 24 hr tablet, TAKE 3 TABLETS (450 MG) BY MOUTH EVERY MORNING, Disp: 270 tablet, Rfl: 2   cetirizine (ZYRTEC) 10 MG tablet, Take 10 mg by mouth daily., Disp: , Rfl:    cyclobenzaprine (FLEXERIL) 10 MG tablet, Take 1 tablet (10 mg total) by mouth 2 (two) times daily as needed for muscle spasms. Do not take with alcohol or while driving or operating heavy machinery.  May cause drowsiness., Disp: 30 tablet, Rfl: 2   ELMIRON 100 MG capsule, TAKE 2 CAPSULES BY MOUTH TWICE A DAY, Disp: 120  capsule, Rfl: 11   fluticasone (FLONASE) 50 MCG/ACT nasal spray, Place 1 spray into both nostrils daily as needed for allergies or rhinitis., Disp: , Rfl:    meloxicam (MOBIC) 15 MG tablet, Take 1 tablet (15 mg total) by mouth daily., Disp: 30 tablet, Rfl: 0   pantoprazole (PROTONIX) 40 MG tablet, Take 40 mg by mouth daily., Disp: , Rfl:    PROAIR HFA 108 (90 Base) MCG/ACT inhaler, Inhale 2 puffs into the lungs every 4 (four) hours as needed for shortness of breath or wheezing., Disp: 1 each, Rfl: 11   rosuvastatin (CRESTOR) 10 MG tablet, Take 10 mg by mouth every Monday, Wednesday, and Friday., Disp: , Rfl:    tolterodine (DETROL LA) 4 MG 24 hr capsule, TAKE 1 CAPSULE BY MOUTH EVERY DAY, Disp: 90 capsule, Rfl: 0   VELIVET 0.1/0.125/0.15 -0.025 MG tablet, TAKE 1 TABLET BY MOUTH EVERY DAY, Disp: 84 tablet, Rfl: 3   VITAMIN D PO, Take 10,000 Units by mouth., Disp: , Rfl:    lidocaine (LIDODERM) 5 %, Place 1 patch onto the skin daily. Remove & Discard patch within 12 hours or as directed by MD (Patient not taking: Reported on 03/18/2023), Disp: 30 patch, Rfl: 3   Nystatin (GERHARDT'S BUTT CREAM) CREA, Apply 1 application. topically 2 (two) times daily. (Patient not taking: Reported on 03/18/2023), Disp: 1 each, Rfl: 11   phenazopyridine (PYRIDIUM) 200 MG tablet, Take 1 tablet (200 mg total) by mouth 3 (three) times daily as needed for pain. (Patient not taking:  Reported on 03/18/2023), Disp: 30 tablet, Rfl: 2   Pseudoephedrine-Ibuprofen (ADVIL COLD/SINUS PO), Take 1 tablet by mouth daily as needed (sinus pressure). (Patient not taking: Reported on 03/18/2023), Disp: , Rfl:    sucralfate (CARAFATE) 1 g tablet, Take 1 tablet (1 g total) by mouth 3 (three) times daily as needed. Dissolve 1 tablet into a glass of water and drink as needed (Patient not taking: Reported on 03/18/2023), Disp: 60 tablet, Rfl: 1  Objective Blood pressure (!) 142/89, pulse (!) 106, height 5\' 4"  (1.626 m), weight 234 lb (106.1  kg).  Open draining skin lesion under the right breast No real erythema  Pertinent ROS No burning with urination, frequency or urgency No nausea, vomiting or diarrhea Nor fever chills or other constitutional symptoms   Labs or studies     Impression + Management Plan: Diagnoses this Encounter::   ICD-10-CM   1. MRSA likely associated skin infectin under right breast  L08.9    B95.62    Bactrim DS + topical silvadene. treated with GV this am        Medications prescribed during  this encounter: No orders of the defined types were placed in this encounter.   Labs or Scans Ordered during this encounter: No orders of the defined types were placed in this encounter.     Follow up No follow-ups on file.

## 2023-03-30 ENCOUNTER — Encounter: Payer: Self-pay | Admitting: Orthopaedic Surgery

## 2023-03-30 ENCOUNTER — Ambulatory Visit: Admitting: Orthopaedic Surgery

## 2023-03-30 ENCOUNTER — Telehealth: Payer: Self-pay | Admitting: Orthopaedic Surgery

## 2023-03-30 VITALS — BP 130/86 | HR 94

## 2023-03-30 DIAGNOSIS — G8929 Other chronic pain: Secondary | ICD-10-CM | POA: Diagnosis not present

## 2023-03-30 DIAGNOSIS — M25561 Pain in right knee: Secondary | ICD-10-CM | POA: Diagnosis not present

## 2023-03-30 MED ORDER — CYCLOBENZAPRINE HCL 10 MG PO TABS: 10.0000 mg | ORAL_TABLET | Freq: Two times a day (BID) | ORAL | 2 refills | Status: DC | PRN

## 2023-03-30 MED ORDER — HYDROCODONE-ACETAMINOPHEN 5-325 MG PO TABS
1.0000 | ORAL_TABLET | ORAL | 0 refills | Status: DC | PRN
Start: 2023-03-30 — End: 2023-03-30

## 2023-03-30 NOTE — Telephone Encounter (Signed)
 She said he is giving her flexeril

## 2023-03-30 NOTE — Patient Instructions (Addendum)
 Out of work note out today through  04/10/23 return on 04/11/23  While we are working on your approval for MRI please go ahead and call to schedule your appointment with [facility] within at least one (1) week.   Central Scheduling [phone]

## 2023-03-30 NOTE — Telephone Encounter (Signed)
 Dr. Sanjuan Dame pt - spoke w/th pt, she stated Dr. Hilda Lias sent Hydrocodone in for her and she's allergic.  She would like something else sent in.

## 2023-03-30 NOTE — Progress Notes (Signed)
 My knee is worse.  She had good results from the injection last time but the pain has returned.  She has swelling and giving way of the right knee.  I will get MRI of the knee.  I will be out of town and I will have Dr. Romeo Apple see her in my absence.  Right knee has effusion, crepitus, ROM 0 to 110, limp right, positive medial [place], NV intact, and no distal edema.  Encounter Diagnosis  Name Primary?   Chronic pain of right knee Yes   PROCEDURE NOTE:  The patient requests injections of the right knee , verbal consent was obtained.  The right knee was prepped appropriately after time out was performed.   Sterile technique was observed and injection of 1 cc of DepoMedrol 40mg  with several cc's of plain xylocaine. Anesthesia was provided by ethyl chloride and a 20-gauge needle was used to inject the knee area. The injection was tolerated well.  A band aid dressing was applied.  The patient was advised to apply ice later today and tomorrow to the injection sight as needed.  I have reviewed the [place] Controlled Substance Reporting System web site prior to prescribing narcotic medicine for this patient.  Stay out of work.  Get MRI.  Call if any problem.  Precautions discussed.  Electronically Signed Darreld Mclean, MD 3/5/20258:33 AM

## 2023-03-31 ENCOUNTER — Ambulatory Visit: Payer: 59 | Admitting: Orthopaedic Surgery

## 2023-04-01 ENCOUNTER — Ambulatory Visit (HOSPITAL_COMMUNITY)
Admission: RE | Admit: 2023-04-01 | Discharge: 2023-04-01 | Disposition: A | Discharge status: Home / Self Care | Source: Ambulatory Visit | Attending: Orthopaedic Surgery | Admitting: Orthopaedic Surgery

## 2023-04-01 DIAGNOSIS — M25561 Pain in right knee: Secondary | ICD-10-CM | POA: Insufficient documentation

## 2023-04-01 DIAGNOSIS — G8929 Other chronic pain: Secondary | ICD-10-CM | POA: Diagnosis present

## 2023-04-05 ENCOUNTER — Ambulatory Visit: Admitting: Obstetrics & Gynecology

## 2023-04-05 ENCOUNTER — Encounter: Payer: Self-pay | Admitting: Obstetrics & Gynecology

## 2023-04-05 VITALS — BP 119/84 | HR 97

## 2023-04-05 DIAGNOSIS — N3010 Interstitial cystitis (chronic) without hematuria: Secondary | ICD-10-CM

## 2023-04-05 DIAGNOSIS — N3281 Overactive bladder: Secondary | ICD-10-CM

## 2023-04-05 NOTE — Progress Notes (Signed)
 Diagnosed with IC: 2018  Current Meds:  Elmiron DMSO Dietary restrictions    Pt states her symptoms have been stable, no exacerbations, although not perfect Wants to continue on this cycle  Blood pressure 119/84, pulse 97.    The external urethra meatus was prepped with betadine DMSO 50 cc was instilled in the usual fashion after the bladder was catheterized and emptied completely 50cc was instilled into the bladder without difficulty and the patient tolerated well She will refrain from voiding as long as possible  Follow up in 8 weeks, or as patient requests based on her symptom complex

## 2023-04-12 ENCOUNTER — Encounter: Payer: Self-pay | Admitting: Orthopedic Surgery

## 2023-04-12 ENCOUNTER — Ambulatory Visit: Admitting: Orthopedic Surgery

## 2023-04-12 DIAGNOSIS — S83231A Complex tear of medial meniscus, current injury, right knee, initial encounter: Secondary | ICD-10-CM

## 2023-04-12 DIAGNOSIS — G8929 Other chronic pain: Secondary | ICD-10-CM

## 2023-04-12 NOTE — Progress Notes (Signed)
 Orthopaedic Clinic Return  Assessment: Amanda Blackwell is a 48 y.o. female with the following: Right knee pain Right knee, medial meniscus, posterior horn complex tear Right medial tibial plateau insufficiency fracture, with underlying bony edema  Plan: Amanda Blackwell has continued right knee pain.  We discussed the findings of the MRI, which demonstrates bony edema within the medial tibial plateau.  I recommended a period of protected weightbearing, using a brace and a walker.  She states she has to continue to work.  Anticipate that this could slowly resolve, if she continues to bear weight.  Recommend she continue to use the brace.  She states her understanding.  If she continues to have issues, she will return to clinic sooner, otherwise I will see her back in 1 month for reassessment.  Follow-up: Return in about 4 weeks (around 05/10/2023).   Subjective:  Chief Complaint  Patient presents with   Knee Pain    R MRI results    History of Present Illness: Amanda Blackwell is a 48 y.o. female who returns to clinic for repeat evaluation of right knee pain.  She has been followed by Dr. Hilda Lias.  She has obtained an MRI, and is here to discuss the findings.  She has been having pain in the right knee for months.  She has had an injection, which only provided some relief for about 2 weeks.  She is now using a brace.  She is taken Flexeril.  She is complaining primarily of pain over the anterior aspect of the knee.  She does not voice concerns for pain in the medial aspect of her knee.  Review of Systems: No fevers or chills No numbness or tingling No chest pain No shortness of breath No bowel or bladder dysfunction No GI distress No headaches   Objective: There were no vitals taken for this visit.  Physical Exam:  Alert and oriented.  No acute distress.  Right sided antalgic gait.  Tenderness to palpation over the medial tibial plateau.  Tenderness to palpation over the medial joint  line.  Pain with flexion beyond 110 degrees.  No increased laxity varus or valgus stress.  Negative Lachman.  She is able to achieve full extension.  Mild effusion of the right knee.  IMAGING: I personally ordered and reviewed the following images:  Right knee MRI  IMPRESSION: 1. Complex tear of the posterior horn medial meniscus with peripheral meniscal extrusion. 2. High-grade partial-thickness cartilage loss of the weight-bearing medial femorotibial compartment and severe subchondral marrow edema at the periphery of the medial tibial plateau and to lesser extent medial femoral condyle. 3. Mild cartilage fissuring of the medial patellar facet hand chondromalacia of the lateral patellar facet.      Oliver Barre, MD 04/12/2023 2:23 PM

## 2023-04-13 ENCOUNTER — Ambulatory Visit: Payer: 59 | Admitting: Obstetrics & Gynecology

## 2023-05-01 ENCOUNTER — Other Ambulatory Visit: Payer: Self-pay | Admitting: Obstetrics & Gynecology

## 2023-05-03 ENCOUNTER — Encounter: Payer: Self-pay | Admitting: Orthopedic Surgery

## 2023-05-03 ENCOUNTER — Ambulatory Visit: Admitting: Orthopedic Surgery

## 2023-05-03 DIAGNOSIS — S83231D Complex tear of medial meniscus, current injury, right knee, subsequent encounter: Secondary | ICD-10-CM | POA: Diagnosis not present

## 2023-05-03 DIAGNOSIS — G8929 Other chronic pain: Secondary | ICD-10-CM

## 2023-05-03 NOTE — Patient Instructions (Addendum)
 Note for work - out for 6 weeks     Recommend protected weightbearing using a walker for the next month.  Wear the brace to protect the knee.   Ice as needed  Medications as needed.  Please bring any paperwork needed from work to the office

## 2023-05-03 NOTE — Progress Notes (Signed)
 Orthopaedic Clinic Return  Assessment: Amanda Blackwell is a 48 y.o. female with the following: Right knee pain Right knee, medial meniscus, posterior horn complex tear Right medial tibial plateau insufficiency fracture, with underlying bony edema  Plan: Amanda Blackwell has continued right knee pain.  Pain continues to get worse throughout the day.  She has continued to try and work, but understands that this is not going to improve without complaints.  I recommended 6 weeks.  She has a brace.  Recommended protected weightbearing using walker.  She has a walker.  I would like to check on her in about a month.  Follow-up: Return in about 4 weeks (around 05/31/2023).   Subjective:  Chief Complaint  Patient presents with   Follow-up    Recheck on right knee    History of Present Illness: Amanda Blackwell is a 48 y.o. female who returns to clinic for repeat evaluation of right knee pain.  Saw her in clinic a few weeks ago.  We reviewed the MRI, which demonstrates a complex medial meniscus tear, with underlying insufficiency fracture and bony edema.  I recommended a period of protected weightbearing.  She attempted to return to work, but continues to have pain.  It is getting worse.  It feels worse at the end of the day.   Review of Systems: No fevers or chills No numbness or tingling No chest pain No shortness of breath No bowel or bladder dysfunction No GI distress No headaches   Objective: There were no vitals taken for this visit.  Physical Exam:  Alert and oriented.  No acute distress.  Right sided antalgic gait.  Tenderness to palpation over the medial tibial plateau.  Tenderness to palpation over the medial joint line.  Pain with flexion beyond 110 degrees.  No increased laxity varus or valgus stress.  Negative Lachman.  She is able to achieve full extension.  Mild effusion of the right knee.  IMAGING: I personally ordered and reviewed the following images:  Right knee  MRI  IMPRESSION: 1. Complex tear of the posterior horn medial meniscus with peripheral meniscal extrusion. 2. High-grade partial-thickness cartilage loss of the weight-bearing medial femorotibial compartment and severe subchondral marrow edema at the periphery of the medial tibial plateau and to lesser extent medial femoral condyle. 3. Mild cartilage fissuring of the medial patellar facet hand chondromalacia of the lateral patellar facet.      Oliver Barre, MD 05/03/2023 3:41 PM

## 2023-05-06 ENCOUNTER — Ambulatory Visit: Admitting: Orthopedic Surgery

## 2023-05-31 ENCOUNTER — Encounter: Payer: Self-pay | Admitting: Orthopedic Surgery

## 2023-05-31 ENCOUNTER — Ambulatory Visit: Admitting: Orthopedic Surgery

## 2023-05-31 DIAGNOSIS — S83231D Complex tear of medial meniscus, current injury, right knee, subsequent encounter: Secondary | ICD-10-CM | POA: Diagnosis not present

## 2023-05-31 DIAGNOSIS — G8929 Other chronic pain: Secondary | ICD-10-CM

## 2023-05-31 NOTE — Patient Instructions (Signed)
 Continue with the knee brace, and offload the right knee.  Back to work, starting May 20  Follow-up in 1 month

## 2023-05-31 NOTE — Progress Notes (Signed)
 Orthopaedic Clinic Return  Assessment: Amanda Blackwell is a 48 y.o. female with the following: Right knee pain Right knee, medial meniscus, posterior horn complex tear Right medial tibial plateau insufficiency fracture, with underlying bony edema  Plan: [name] states her right knee pain has improved.  However, she continues to have some tenderness.  She has been wearing a brace, and offloading the knee for about a month.  I have recommended an additional 2 weeks.  Okay to return to work in approximately 2 weeks.  I would like to see her back in a month.  Depending on how she is feeling at that time, we can revisit the possibility of right shoulder arthroscopy, with potential meniscus repair.  Follow-up: Return in about 4 weeks (around 06/28/2023).   Subjective:  Chief Complaint  Patient presents with   Knee Pain    R states she does feel a difference and it's less painful over the bruise/fx. Does have good and bad days still with bracing and home exercises.     History of Present Illness: Amanda Blackwell is a 48 y.o. female who returns to clinic for repeat evaluation of right knee pain.  She has a meniscus injury, high-grade partial-thickness cartilage injury, as well as insufficiency fracture of the medial tibial plateau.  She has been offloading the knee, wearing a brace.  She was using a walker, and is now using a cane on a more consistent basis.  Her pain is much better.  However, she does continue to have some discomfort in the medial aspect of the right knee.  She has not returned to work yet.   Review of Systems: No fevers or chills No numbness or tingling No chest pain No shortness of breath No bowel or bladder dysfunction No GI distress No headaches   Objective: There were no vitals taken for this visit.  Physical Exam:  Alert and oriented.  No acute distress.  Right sided antalgic gait.  Tenderness to palpation over the medial tibial plateau.  Tenderness to  palpation over the medial joint line.  Pain with flexion beyond 110 degrees.  No increased laxity varus or valgus stress.  Negative Lachman.  She is able to achieve full extension.  Mild effusion of the right knee.  IMAGING: I personally ordered and reviewed the following images:  Right knee MRI  IMPRESSION: 1. Complex tear of the posterior horn medial meniscus with peripheral meniscal extrusion. 2. High-grade partial-thickness cartilage loss of the weight-bearing medial femorotibial compartment and severe subchondral marrow edema at the periphery of the medial tibial plateau and to lesser extent medial femoral condyle. 3. Mild cartilage fissuring of the medial patellar facet hand chondromalacia of the lateral patellar facet.      Tonita Frater, MD 05/31/2023 2:51 PM

## 2023-06-01 ENCOUNTER — Encounter: Payer: Self-pay | Admitting: Obstetrics & Gynecology

## 2023-06-01 ENCOUNTER — Ambulatory Visit: Admitting: Obstetrics & Gynecology

## 2023-06-01 VITALS — BP 137/88 | HR 106 | Ht 64.0 in | Wt 242.0 lb

## 2023-06-01 DIAGNOSIS — N3281 Overactive bladder: Secondary | ICD-10-CM | POA: Diagnosis not present

## 2023-06-01 DIAGNOSIS — N3010 Interstitial cystitis (chronic) without hematuria: Secondary | ICD-10-CM

## 2023-06-01 NOTE — Progress Notes (Signed)
 Diagnosed with IC: 2018  Current Meds:  Elmiron  + DMSO Dietary restrictions    Pt states her symptoms have been stable, no exacerbations, although not perfect Wants to continue on this cycle  Blood pressure 137/88, pulse (!) 106, height 5\' 4"  (1.626 m), weight 242 lb (109.8 kg).    The external urethra meatus was prepped with betadine DMSO 50 cc was instilled in the usual fashion after the bladder was catheterized and emptied completely 50cc was instilled into the bladder without difficulty and the patient tolerated well She will refrain from voiding as long as possible  Follow up in 8 weeks, or as patient requests based on her symptom complex

## 2023-06-11 ENCOUNTER — Telehealth: Payer: Self-pay | Admitting: Orthopaedic Surgery

## 2023-06-13 ENCOUNTER — Other Ambulatory Visit: Payer: Self-pay | Admitting: Adult Health

## 2023-06-15 ENCOUNTER — Encounter: Payer: Self-pay | Admitting: Obstetrics & Gynecology

## 2023-06-15 MED ORDER — TOLTERODINE TARTRATE ER 4 MG PO CP24: 4.0000 mg | ORAL_CAPSULE | Freq: Every day | ORAL | 11 refills | Status: AC

## 2023-06-23 ENCOUNTER — Other Ambulatory Visit: Payer: Self-pay | Admitting: Obstetrics & Gynecology

## 2023-06-24 ENCOUNTER — Encounter: Payer: Self-pay | Admitting: *Deleted

## 2023-06-28 ENCOUNTER — Ambulatory Visit: Admitting: Orthopedic Surgery

## 2023-07-04 ENCOUNTER — Encounter: Payer: Self-pay | Admitting: Obstetrics & Gynecology

## 2023-07-05 ENCOUNTER — Encounter: Payer: Self-pay | Admitting: Orthopedic Surgery

## 2023-07-05 ENCOUNTER — Ambulatory Visit: Admitting: Orthopedic Surgery

## 2023-07-05 DIAGNOSIS — S83231D Complex tear of medial meniscus, current injury, right knee, subsequent encounter: Secondary | ICD-10-CM | POA: Diagnosis not present

## 2023-07-05 DIAGNOSIS — G8929 Other chronic pain: Secondary | ICD-10-CM

## 2023-07-05 NOTE — Progress Notes (Signed)
 Orthopaedic Clinic Return  Assessment: Amanda Blackwell is a 48 y.o. female with the following: Right knee pain Right knee, medial meniscus, posterior horn complex tear Right medial tibial plateau insufficiency fracture, with underlying bony edema  Plan: [name] is much better.  Her pain in the right knee feels a lot better.  She does have some residual anterior knee pain, which is most notable when she is going up stairs.  This could be secondary to some weakness in the quadriceps.  This was discussed with the patient.  She is comfortable with her improvements thus far.  She will continue to use a brace.  Medicines as needed.  If she continues to have issues, we can consider an injection.  In addition, we can consider formal physical therapy.  All questions have been answered.  She is in agreement with this plan.  Follow-up: Return if symptoms worsen or fail to improve.   Subjective:  Chief Complaint  Patient presents with   Knee Pain    History of Present Illness: Amanda Blackwell is a 48 y.o. female who returns to clinic for repeat evaluation of right knee pain.  She has a meniscus injury, high-grade partial-thickness cartilage injury, as well as insufficiency fracture of the medial tibial plateau.  Overall, she feels a lot better.  Very little tenderness to palpation.  The only pain she has is in the anterior aspect of the knee.  This is most noticeable when she is walking upstairs.  She continues to wear a brace while at work.  Review of Systems: No fevers or chills No numbness or tingling No chest pain No shortness of breath No bowel or bladder dysfunction No GI distress No headaches   Objective: There were no vitals taken for this visit.  Physical Exam:  Alert and oriented.  No acute distress.  Normal gait  No tenderness to palpation over the medial tibial plateau or the medial femoral condyle.  Mild tenderness over the medial joint line.  Pain with flexion beyond 110  degrees.  No increased laxity varus or valgus stress.  Negative Lachman.  She is able to achieve full extension.  Mild effusion of the right knee.  IMAGING: I personally ordered and reviewed the following images:  Right knee MRI  IMPRESSION: 1. Complex tear of the posterior horn medial meniscus with peripheral meniscal extrusion. 2. High-grade partial-thickness cartilage loss of the weight-bearing medial femorotibial compartment and severe subchondral marrow edema at the periphery of the medial tibial plateau and to lesser extent medial femoral condyle. 3. Mild cartilage fissuring of the medial patellar facet hand chondromalacia of the lateral patellar facet.      Tonita Frater, MD 07/05/2023 3:40 PM

## 2023-08-02 ENCOUNTER — Encounter: Payer: Self-pay | Admitting: Obstetrics & Gynecology

## 2023-08-02 ENCOUNTER — Ambulatory Visit: Admitting: Obstetrics & Gynecology

## 2023-08-02 VITALS — BP 128/79 | HR 106 | Ht 64.0 in | Wt 235.0 lb

## 2023-08-02 DIAGNOSIS — N3010 Interstitial cystitis (chronic) without hematuria: Secondary | ICD-10-CM | POA: Diagnosis not present

## 2023-08-02 DIAGNOSIS — N3281 Overactive bladder: Secondary | ICD-10-CM

## 2023-08-02 NOTE — Progress Notes (Signed)
 Diagnosed with IC: 2018  Current Meds:  Elmiron  + DMSO Dietary restrictions    Pt states her symptoms have been stable, no exacerbations, although not perfect Wants to continue on this cycle  Blood pressure 128/79, pulse (!) 106, height 5' 4 (1.626 m), weight 235 lb (106.6 kg).    The external urethra meatus was prepped with betadine DMSO 50 cc was instilled in the usual fashion after the bladder was catheterized and emptied completely 50cc was instilled into the bladder without difficulty and the patient tolerated well She will refrain from voiding as long as possible  Follow up in 8 weeks, or as patient requests based on her symptom complex

## 2023-08-04 ENCOUNTER — Ambulatory Visit: Admitting: Obstetrics & Gynecology

## 2023-09-06 ENCOUNTER — Encounter: Payer: Self-pay | Admitting: Family Medicine

## 2023-09-06 ENCOUNTER — Ambulatory Visit: Admitting: Family Medicine

## 2023-09-06 VITALS — BP 126/84 | HR 93 | Temp 98.6°F | Ht 64.0 in | Wt 241.6 lb

## 2023-09-06 DIAGNOSIS — N3010 Interstitial cystitis (chronic) without hematuria: Secondary | ICD-10-CM | POA: Diagnosis not present

## 2023-09-06 DIAGNOSIS — F419 Anxiety disorder, unspecified: Secondary | ICD-10-CM

## 2023-09-06 DIAGNOSIS — Z0000 Encounter for general adult medical examination without abnormal findings: Secondary | ICD-10-CM | POA: Insufficient documentation

## 2023-09-06 DIAGNOSIS — Z1321 Encounter for screening for nutritional disorder: Secondary | ICD-10-CM

## 2023-09-06 DIAGNOSIS — E785 Hyperlipidemia, unspecified: Secondary | ICD-10-CM

## 2023-09-06 DIAGNOSIS — Z23 Encounter for immunization: Secondary | ICD-10-CM | POA: Diagnosis not present

## 2023-09-06 DIAGNOSIS — Z0001 Encounter for general adult medical examination with abnormal findings: Secondary | ICD-10-CM | POA: Diagnosis not present

## 2023-09-06 DIAGNOSIS — H9192 Unspecified hearing loss, left ear: Secondary | ICD-10-CM

## 2023-09-06 DIAGNOSIS — Z1159 Encounter for screening for other viral diseases: Secondary | ICD-10-CM

## 2023-09-06 DIAGNOSIS — Z114 Encounter for screening for human immunodeficiency virus [HIV]: Secondary | ICD-10-CM

## 2023-09-06 DIAGNOSIS — K5900 Constipation, unspecified: Secondary | ICD-10-CM

## 2023-09-06 DIAGNOSIS — Z1329 Encounter for screening for other suspected endocrine disorder: Secondary | ICD-10-CM

## 2023-09-06 NOTE — Assessment & Plan Note (Signed)
 Counseled on importance of weight management for overall health. Encourage low calorie, heart healthy diet and moderate intensity exercise 150 minutes weekly. This is 3-5 times weekly for 30-50 minutes each session. Goal should be pace of 3 miles/hours, or walking 1.5 miles in 30 minutes and include strength training.

## 2023-09-06 NOTE — Assessment & Plan Note (Signed)
 Followed by GYN. Well controlled on current regimen.

## 2023-09-06 NOTE — Assessment & Plan Note (Signed)
 No red flags. Counseled on importance of high fiber diet and staying hydrated. PRN laxatives.

## 2023-09-06 NOTE — Addendum Note (Signed)
 Addended by: CORINNA BRISKER R on: 09/06/2023 12:51 PM   Modules accepted: Orders

## 2023-09-06 NOTE — Assessment & Plan Note (Signed)
 Labs today. Continue crestor  10mg  daily. I recommend consuming a heart healthy diet such as Mediterranean diet or DASH diet with whole grains, fruits, vegetable, fish, lean meats, nuts, and olive oil. Limit sweets and processed foods. I also encourage moderate intensity exercise 150 minutes weekly. This is 3-5 times weekly for 30-50 minutes each session. Goal should be pace of 3 miles/hours, or walking 1.5 miles in 30 minutes.

## 2023-09-06 NOTE — Assessment & Plan Note (Signed)
 Today your medical history was reviewed and routine physical exam with labs was performed. Recommend 150 minutes of moderate intensity exercise weekly and consuming a well-balanced diet. Advised to stop smoking if a smoker, avoid smoking if a non-smoker, limit alcohol  consumption to 1 drink per day for women and 2 drinks per day for men, and avoid illicit drug use.Counseled on the importance of sunscreen use. Counseled in mental health awareness and when to seek medical care. Vaccine maintenance discussed. Appropriate health maintenance items reviewed. Return to office in 1 year for annual physical exam.

## 2023-09-06 NOTE — Progress Notes (Signed)
 Complete physical exam  Patient: Amanda Blackwell   DOB: 04/25/1975   48 y.o. Female  MRN: [id number]  Subjective:    Chief Complaint  Patient presents with   Establish Care    Amanda Blackwell is a 47 y.o. female who presents today for a complete physical exam. She reports consuming a general diet. The patient does not participate in regular exercise at present. She generally feels well. She reports sleeping well. She does not have additional problems to discuss today. She does see OBGYN for IC. UTD on mammogram and PAP.    Most recent fall risk assessment:    09/06/2023   11:51 AM  Fall Risk   Falls in the past year? 0  Number falls in past yr: 0  Injury with Fall? 1  Risk for fall due to : Other (Comment)  Risk for fall due to: Comment knee injury  Follow up Falls evaluation completed     Most recent depression screenings:    09/06/2023   12:10 PM 12/10/2021    2:40 PM  PHQ 2/9 Scores  PHQ - 2 Score 3 1  PHQ- 9 Score 8 2      09/06/2023   12:11 PM 12/10/2021    2:41 PM  GAD 7 : Generalized Anxiety Score  Nervous, Anxious, on Edge 1 2  Control/stop worrying 1 1  Worry too much - different things 1 1  Trouble relaxing 1 0  Restless 0 0  Easily annoyed or irritable 1 1  Afraid - awful might happen 1 1  Total GAD 7 Score 6 6  Anxiety Difficulty Somewhat difficult       Vision:Within last year and Dental: No current dental problems and Receives regular dental care  Patient Active Problem List   Diagnosis Date Noted   Physical exam, annual 09/06/2023   Hyperlipidemia 09/06/2023   Morbid obesity (HCC) 09/06/2023   Hearing loss of left ear 09/06/2023   Constipation 04/08/2020   Special screening for malignant neoplasms, colon 08/09/2017   Family hx of colon cancer 08/09/2017   IC (interstitial cystitis) 01/31/2015   Stomach pain 01/03/2015   Family history of colon cancer in mother 01/03/2015   Anxiety 09/11/2013   Contraceptive management 09/11/2013   OAB  (overactive bladder) 02/16/2013   Female genital symptoms 02/14/2013   Vaginal discharge 02/14/2013   Past Medical History:  Diagnosis Date   Allergy    Anxiety    Asthma    with seasonal changes   Depression    Deviated septum 06/2012   Family history of colon cancer in mother 01/03/2015   Frequent urination    GERD (gastroesophageal reflux disease)    Headache(784.0)    sinus   IC (interstitial cystitis) 01/31/2015   Nasal turbinate hypertrophy 06/2012   bilateral   OAB (overactive bladder) 02/16/2013   Trial myrbetriq    PONV (postoperative nausea and vomiting)    Seasonal allergies    Stomach pain 01/03/2015   Unspecified symptom associated with female genital organs 02/14/2013   Past Surgical History:  Procedure Laterality Date   CESAREAN SECTION  10/20/2006   CHOLECYSTECTOMY  07/14/2005   laparoscopic   COLONOSCOPY WITH PROPOFOL  N/A 05/02/2020   Procedure: COLONOSCOPY WITH PROPOFOL ;  Surgeon: Cindie Carlin POUR, DO;  Location: [facility];  Service: Endoscopy;  Laterality: N/A;  AM, pt tested + 02/29/20, results emailed to [facility]   CYST EXCISION Left 08/14/2001   postauricular   dilation and currettage  NASAL SEPTOPLASTY W/ TURBINOPLASTY Bilateral 07/24/2012   Procedure: NASAL SEPTOPLASTY WITH BILATERAL TURBINATE RESECTION;  Surgeon: Ana LELON Moccasin, MD;  Location: [facility];  Service: ENT;  Laterality: Bilateral;   POLYPECTOMY  05/02/2020   Procedure: POLYPECTOMY;  Surgeon: Cindie Carlin POUR, DO;  Location: [facility];  Service: Endoscopy;;  cecal polyp   Social History   Tobacco Use   Smoking status: Former    Current packs/day: 0.00    Average packs/day: 0.9 packs/day for 22.0 years (19.0 ttl pk-yrs)    Types: E-cigarettes, Cigarettes    Start date: 11/11/1994    Quit date: 11/25/2016    Years since quitting: 6.7   Smokeless tobacco: Never   Tobacco comments:    vapes  Vaping Use   Vaping status: Every Day   Substances: Nicotine, Flavoring   Substance Use Topics   Alcohol use: Not Currently    Comment: occ   Drug use: No   Family History  Problem Relation Age of Onset   Anesthesia problems Maternal Grandmother        hard to wake up post-op   Cancer Maternal Grandmother        cervical   Congestive Heart Failure Maternal Grandmother    Cancer Mother 82       colon   Diabetes Mother    Depression Mother    Parkinson's disease Father    ADD / ADHD Daughter    Diabetes Maternal Aunt    Cancer Paternal Grandmother        breast   Breast cancer Paternal Grandmother        ? age of onset   Cancer Paternal Grandfather        lung   Allergies  Allergen Reactions   Hydrocodone  Itching   Latex Rash      Patient Care Team: Kayla Jeoffrey RAMAN, FNP as PCP - General (Family Medicine) Cindie Carlin POUR, DO as Consulting Physician (Internal Medicine)   Outpatient Medications Prior to Visit  Medication Sig   albuterol  (VENTOLIN  HFA) 108 (90 Base) MCG/ACT inhaler TAKE 2 PUFFS BY MOUTH EVERY 6 HOURS AS NEEDED FOR WHEEZE OR SHORTNESS OF BREATH   ALPRAZolam  (XANAX ) 0.5 MG tablet TAKE 1 TABLET (0.5 MG TOTAL) BY MOUTH AT BEDTIME AS NEEDED.   Ascorbic Acid (VITAMIN C) 500 MG CHEW Chew 500 mg by mouth at bedtime. (Patient taking differently: Chew 500 mg by mouth at bedtime. 2 tablets nightly)   buPROPion  (WELLBUTRIN  XL) 150 MG 24 hr tablet TAKE 3 TABLETS (450 MG) BY MOUTH EVERY MORNING   cetirizine (ZYRTEC) 10 MG tablet Take 10 mg by mouth daily.   cyclobenzaprine  (FLEXERIL ) 10 MG tablet TAKE 1 TABLET (10 MG TOTAL) BY MOUTH 2 (TWO) TIMES DAILY AS NEEDED FOR MUSCLE SPASMS. DO NOT TAKE WITH ALCOHOL OR WHILE DRIVING OR OPERATING HEAVY MACHINERY. MAY CAUSE DROWSINESS.   ELMIRON  100 MG capsule TAKE 2 CAPSULES BY MOUTH TWICE A DAY   fluticasone (FLONASE) 50 MCG/ACT nasal spray Place 1 spray into both nostrils daily as needed for allergies or rhinitis.   pantoprazole  (PROTONIX ) 40 MG tablet Take 40 mg by mouth daily.   Probiotic Product  (PRO-BIOTIC BLEND PO) Take 1 tablet by mouth daily.   Pseudoephedrine-Ibuprofen  (ADVIL  COLD/SINUS PO) Take 1 tablet by mouth daily as needed (sinus pressure).   rosuvastatin  (CRESTOR ) 10 MG tablet Take 10 mg by mouth every Monday, Wednesday, and Friday.   silver  sulfADIAZINE  (SILVADENE ) 1 % cream Use to area 3  times per day   tolterodine  (DETROL  LA) 4 MG 24 hr capsule Take 1 capsule (4 mg total) by mouth daily.   VELIVET 0.1/0.125/0.15 -0.025 MG tablet TAKE 1 TABLET BY MOUTH EVERY DAY   VITAMIN D  PO Take 10,000 Units by mouth.   lidocaine  (LIDODERM ) 5 % Place 1 patch onto the skin daily. Remove & Discard patch within 12 hours or as directed by MD (Patient not taking: Reported on 09/06/2023)   meloxicam  (MOBIC ) 15 MG tablet Take 1 tablet (15 mg total) by mouth daily. (Patient not taking: Reported on 09/06/2023)   Nystatin (GERHARDT'S BUTT CREAM) CREA Apply 1 application. topically 2 (two) times daily. (Patient not taking: Reported on 09/06/2023)   phenazopyridine  (PYRIDIUM ) 200 MG tablet Take 1 tablet (200 mg total) by mouth 3 (three) times daily as needed for pain. (Patient not taking: Reported on 09/06/2023)   sucralfate  (CARAFATE ) 1 g tablet Take 1 tablet (1 g total) by mouth 3 (three) times daily as needed. Dissolve 1 tablet into a glass of water and drink as needed (Patient not taking: Reported on 09/06/2023)   sulfamethoxazole -trimethoprim  (BACTRIM  DS) 800-160 MG tablet Take 1 tablet by mouth 2 (two) times daily. (Patient not taking: Reported on 09/06/2023)   No facility-administered medications prior to visit.    Review of Systems  Constitutional: Negative.   HENT:  Positive for hearing loss.   Eyes: Negative.   Respiratory: Negative.    Cardiovascular: Negative.   Gastrointestinal:  Positive for constipation and heartburn.  Genitourinary: Negative.   Musculoskeletal: Negative.   Skin: Negative.   Neurological: Negative.   Endo/Heme/Allergies: Negative.   Psychiatric/Behavioral:  Negative.    All other systems reviewed and are negative.         Objective:     BP 126/84   Pulse 93   Temp 98.6 F (37 C)   Ht 5' 4 (1.626 m)   Wt 241 lb 9.6 oz (109.6 kg)   LMP  (Exact Date)   SpO2 97%   BMI 41.47 kg/m  BP Readings from Last 3 Encounters:  09/06/23 126/84  08/02/23 128/79  06/01/23 137/88   Wt Readings from Last 3 Encounters:  09/06/23 241 lb 9.6 oz (109.6 kg)  08/02/23 235 lb (106.6 kg)  06/01/23 242 lb (109.8 kg)      Physical Exam Vitals and nursing note reviewed.  Constitutional:      Appearance: Normal appearance. She is obese.  HENT:     Head: Normocephalic and atraumatic.     Right Ear: Tympanic membrane, ear canal and external ear normal.     Left Ear: Tympanic membrane, ear canal and external ear normal. Decreased hearing noted.     Nose: Nose normal.     Mouth/Throat:     Mouth: Mucous membranes are moist.     Pharynx: Oropharynx is clear.  Eyes:     Extraocular Movements: Extraocular movements intact.     Conjunctiva/sclera: Conjunctivae normal.     Pupils: Pupils are equal, round, and reactive to light.  Cardiovascular:     Rate and Rhythm: Normal rate and regular rhythm.     Pulses: Normal pulses.     Heart sounds: Normal heart sounds.  Pulmonary:     Effort: Pulmonary effort is normal.     Breath sounds: Normal breath sounds.  Abdominal:     General: Bowel sounds are normal.     Palpations: Abdomen is soft.  Musculoskeletal:        General: Normal range  of motion.     Cervical back: Normal range of motion and neck supple.  Skin:    General: Skin is warm and dry.     Capillary Refill: Capillary refill takes less than 2 seconds.  Neurological:     General: No focal deficit present.     Mental Status: She is alert and oriented to person, place, and time. Mental status is at baseline.  Psychiatric:        Mood and Affect: Mood normal.        Behavior: Behavior normal.        Thought Content: Thought content normal.         Judgment: Judgment normal.      No results found for any visits on 09/06/23. Last CBC Lab Results  Component Value Date   WBC 8.7 04/13/2018   HGB 13.6 04/13/2018   HCT 41.0 04/13/2018   MCV 88.4 04/13/2018   MCH 29.3 04/13/2018   RDW 12.4 04/13/2018   PLT 307 04/13/2018   Last metabolic panel Lab Results  Component Value Date   GLUCOSE 89 04/13/2018   NA 137 04/13/2018   K 4.0 04/13/2018   CL 105 04/13/2018   CO2 22 04/13/2018   BUN 12 04/13/2018   CREATININE 0.68 04/13/2018   GFRNONAA >60 04/13/2018   CALCIUM  9.7 04/13/2018   PROT 7.5 04/13/2018   ALBUMIN 4.3 04/13/2018   BILITOT 0.6 04/13/2018   ALKPHOS 50 04/13/2018   AST 19 04/13/2018   ALT 21 04/13/2018   ANIONGAP 10 04/13/2018   Last lipids No results found for: CHOL, HDL, LDLCALC, LDLDIRECT, TRIG, CHOLHDL Last hemoglobin A1c No results found for: HGBA1C Last thyroid functions Lab Results  Component Value Date   TSH 1.353 02/14/2013   Last vitamin D  No results found for: 25OHVITD2, 25OHVITD3, VD25OH Last vitamin B12 and Folate No results found for: VITAMINB12, FOLATE      Assessment & Plan:    Routine Health Maintenance and Physical Exam   There is no immunization history on file for this patient.  Health Maintenance  Topic Date Due   HIV Screening  Never done   Hepatitis C Screening  Never done   DTaP/Tdap/Td (1 - Tdap) Never done   Hepatitis B Vaccines (1 of 3 - 19+ 3-dose series) Never done   COVID-19 Vaccine (1 - 2024-25 season) 09/22/2023 (Originally 09/26/2022)   INFLUENZA VACCINE  04/24/2024 (Originally 08/26/2023)   Cervical Cancer Screening (HPV/Pap Cotest)  12/11/2026   Colonoscopy  05/03/2030   HPV VACCINES  Aged Out   Meningococcal B Vaccine  Aged Out    Discussed health benefits of physical activity, and encouraged her to engage in regular exercise appropriate for her age and condition.  Problem List Items Addressed This Visit     Anxiety    Well controlled on Wellbutrin  and Xanax  PRN. GAD 6.       Relevant Orders   VITAMIN D  25 Hydroxy (Vit-D Deficiency, Fractures)   IC (interstitial cystitis)   Followed by GYN. Well controlled on current regimen.      Constipation   No red flags. Counseled on importance of high fiber diet and staying hydrated. PRN laxatives.      Physical exam, annual - Primary   Today your medical history was reviewed and routine physical exam with labs was performed. Recommend 150 minutes of moderate intensity exercise weekly and consuming a well-balanced diet. Advised to stop smoking if a smoker, avoid smoking if a non-smoker, limit alcohol  consumption to 1 drink per day for women and 2 drinks per day for men, and avoid illicit drug use. Counseled on the importance of sunscreen use. Counseled in mental health awareness and when to seek medical care. Vaccine maintenance discussed. Appropriate health maintenance items reviewed. Return to office in 1 year for annual physical exam.       Relevant Orders   CBC with Differential/Platelet   Comprehensive metabolic panel with GFR   Lipid panel   TSH   VITAMIN D  25 Hydroxy (Vit-D Deficiency, Fractures)   Hemoglobin A1c   Hyperlipidemia   Labs today. Continue crestor  10mg  daily. I recommend consuming a heart healthy diet such as Mediterranean diet or DASH diet with whole grains, fruits, vegetable, fish, lean meats, nuts, and olive oil. Limit sweets and processed foods. I also encourage moderate intensity exercise 150 minutes weekly. This is 3-5 times weekly for 30-50 minutes each session. Goal should be pace of 3 miles/hours, or walking 1.5 miles in 30 minutes.       Relevant Orders   Lipid panel   Morbid obesity (HCC)   Counseled on importance of weight management for overall health. Encourage low calorie, heart healthy diet and moderate intensity exercise 150 minutes weekly. This is 3-5 times weekly for 30-50 minutes each session. Goal should be pace of 3  miles/hours, or walking 1.5 miles in 30 minutes and include strength training.       Relevant Orders   CBC with Differential/Platelet   Comprehensive metabolic panel with GFR   Lipid panel   TSH   Hemoglobin A1c   Hearing loss of left ear   Chronic with family history. Referral to audiology      Relevant Orders   Ambulatory referral to Audiology   Other Visit Diagnoses       Screening for thyroid disorder       Relevant Orders   TSH     Encounter for vitamin deficiency screening       Relevant Orders   VITAMIN D  25 Hydroxy (Vit-D Deficiency, Fractures)     Screening for HIV (human immunodeficiency virus)       Relevant Orders   HIV Antibody (routine testing w rflx)     Need for hepatitis C screening test       Relevant Orders   Hepatitis C antibody      Return in about 6 months (around 03/08/2024) for chronic follow-up with labs 1 week prior.     Jeoffrey GORMAN Barrio, FNP

## 2023-09-06 NOTE — Assessment & Plan Note (Signed)
 Chronic with family history. Referral to audiology

## 2023-09-06 NOTE — Assessment & Plan Note (Addendum)
 Well controlled on Wellbutrin  and Xanax  PRN. GAD 6.

## 2023-09-07 ENCOUNTER — Ambulatory Visit: Payer: Self-pay | Admitting: Family Medicine

## 2023-09-07 LAB — LIPID PANEL
Cholesterol: 188 mg/dL (ref <200)
HDL: 64 mg/dL (ref >=50)
LDL Cholesterol (Calc): 85 mg/dL
Non-HDL Cholesterol (Calc): 124 mg/dL (ref <130)
Total CHOL/HDL Ratio: 2.9 (calc) (ref <5.0)
Triglycerides: 325 mg/dL — ABNORMAL HIGH (ref <150)

## 2023-09-07 LAB — HEMOGLOBIN A1C
Hgb A1c MFr Bld: 6.8 % — ABNORMAL HIGH (ref <5.7)
Mean Plasma Glucose: 148 mg/dL
eAG (mmol/L): 8.2 mmol/L

## 2023-09-07 LAB — CBC WITH DIFFERENTIAL/PLATELET
Absolute Lymphocytes: 2465 {cells}/uL (ref 850–3900)
Absolute Monocytes: 577 {cells}/uL (ref 200–950)
Basophils Absolute: 63 {cells}/uL (ref 0–200)
Basophils Relative: 0.8 %
Eosinophils Absolute: 316 {cells}/uL (ref 15–500)
Eosinophils Relative: 4 %
HCT: 41 % (ref 35.0–45.0)
Hemoglobin: 13.2 g/dL (ref 11.7–15.5)
MCH: 29.6 pg (ref 27.0–33.0)
MCHC: 32.2 g/dL (ref 32.0–36.0)
MCV: 91.9 fL (ref 80.0–100.0)
MPV: 11 fL (ref 7.5–12.5)
Monocytes Relative: 7.3 %
Neutro Abs: 4479 {cells}/uL (ref 1500–7800)
Neutrophils Relative %: 56.7 %
Platelets: 338 Thousand/uL (ref 140–400)
RBC: 4.46 Million/uL (ref 3.80–5.10)
RDW: 12.2 % (ref 11.0–15.0)
Total Lymphocyte: 31.2 %
WBC: 7.9 Thousand/uL (ref 3.8–10.8)

## 2023-09-07 LAB — COMPREHENSIVE METABOLIC PANEL WITH GFR
AG Ratio: 1.7 (calc) (ref 1.0–2.5)
ALT: 17 U/L (ref 6–29)
AST: 30 U/L (ref 10–35)
Albumin: 4.6 g/dL (ref 3.6–5.1)
Alkaline phosphatase (APISO): 56 U/L (ref 31–125)
BUN: 10 mg/dL (ref 7–25)
CO2: 25 mmol/L (ref 20–32)
Calcium: 9.8 mg/dL (ref 8.6–10.2)
Chloride: 102 mmol/L (ref 98–110)
Creat: 0.74 mg/dL (ref 0.50–0.99)
Globulin: 2.7 g/dL (ref 1.9–3.7)
Glucose, Bld: 86 mg/dL (ref 65–99)
Potassium: 4.2 mmol/L (ref 3.5–5.3)
Sodium: 138 mmol/L (ref 135–146)
Total Bilirubin: 0.6 mg/dL (ref 0.2–1.2)
Total Protein: 7.3 g/dL (ref 6.1–8.1)
eGFR: 100 mL/min/1.73m2 (ref >=60)

## 2023-09-07 LAB — TSH: TSH: 1.51 m[IU]/L

## 2023-09-07 LAB — HEPATITIS C ANTIBODY: Hepatitis C Ab: NONREACTIVE

## 2023-09-07 LAB — HIV ANTIBODY (ROUTINE TESTING W REFLEX): HIV 1&2 Ab, 4th Generation: NONREACTIVE

## 2023-09-07 LAB — VITAMIN D 25 HYDROXY (VIT D DEFICIENCY, FRACTURES): Vit D, 25-Hydroxy: 44 ng/mL (ref 30–100)

## 2023-09-11 ENCOUNTER — Other Ambulatory Visit: Payer: Self-pay | Admitting: Orthopaedic Surgery

## 2023-09-16 ENCOUNTER — Encounter: Payer: Self-pay | Admitting: Radiology

## 2023-09-19 ENCOUNTER — Other Ambulatory Visit: Payer: Self-pay | Admitting: Family Medicine

## 2023-09-19 MED ORDER — ROSUVASTATIN CALCIUM 10 MG PO TABS: 10.0000 mg | ORAL_TABLET | ORAL | 1 refills | Status: AC

## 2023-09-29 ENCOUNTER — Ambulatory Visit: Admitting: Obstetrics & Gynecology

## 2023-09-29 VITALS — BP 129/84 | HR 102 | Ht 64.0 in | Wt 237.0 lb

## 2023-09-29 DIAGNOSIS — N3010 Interstitial cystitis (chronic) without hematuria: Secondary | ICD-10-CM | POA: Diagnosis not present

## 2023-09-29 DIAGNOSIS — N3281 Overactive bladder: Secondary | ICD-10-CM

## 2023-09-29 NOTE — Progress Notes (Signed)
 Diagnosed with IC: 2018  Current Meds:  Elmiron  + DMSO Dietary restrictions    Pt states her symptoms have been stable, no exacerbations, although not perfect Wants to continue on this cycle  Blood pressure 129/84, pulse (!) 102, height 5' 4 (1.626 m), weight 237 lb (107.5 kg).    The external urethra meatus was prepped with betadine DMSO 50 cc was instilled in the usual fashion after the bladder was catheterized and emptied completely 50cc was instilled into the bladder without difficulty and the patient tolerated well She will refrain from voiding as long as possible  Follow up in 8 weeks, or as patient requests based on her symptom complex

## 2023-10-04 ENCOUNTER — Ambulatory Visit: Attending: Family Medicine | Admitting: Audiologist

## 2023-10-04 DIAGNOSIS — H903 Sensorineural hearing loss, bilateral: Secondary | ICD-10-CM | POA: Diagnosis present

## 2023-10-04 NOTE — Procedures (Signed)
  [facility] [address] [place], [place]  [address] [phone]  AUDIOLOGICAL  EVALUATION  NAME: Amanda Blackwell     DOB:   1975-09-09      MRN: [id number]                                                                                     DATE: 10/04/2023     REFERENT: Kayla Jeoffrey RAMAN, FNP STATUS: Outpatient DIAGNOSIS: Sensorineural Hearing Loss Bilateral    History: [name] was seen for an audiological evaluation due to difficulty hearing from the left ear. [name] has felt her hearing slowly declining. She has hearing screenings with [employer] and has failed the last three. She was instructed to get testing with an audiologist. Her father and grandfather wore hearing aids. She feels its likely she will also need aids. [name] feels her right ear hears well.  [name] denies pain, pressure, or tinnitus.  [name] has occupational history of hazardous noise exposure.  Medical history shows no additional risk for hearing loss.    Evaluation:  Otoscopy showed a clear view of the tympanic membranes, bilaterally Tympanometry results were consistent with normal middle ear function, bilaterally   Audiometric testing was completed using Conventional Audiometry techniques with insert earphones and supraural headphones. Test results are consistent with moderate cookie bite sensorineural hearing loss bilaterally. Speech Recognition Thresholds were obtained at 35dB HL in the right ear and at 35dB HL in the left ear. Word Recognition Testing was completed at  40dB SL and [name] scored 100% in each ear.    Results:  The test results were reviewed with [name]. [name] has a cookie bite sensorineural hearing loss in each ear. [name] needs hearing aids.  Audiogram printed and provided to [name].   Recommendations: Hearing aids recommended for both ears. Patient given handout about how to access hearing aid benefits with [employer].  Annual audiometric  testing recommended to monitor hearing loss for progression.    31 minutes spent testing and counseling on results.   If you have any questions please feel free to contact me at [phone].  Lauraine Ka Stalnaker Au.D.  Audiologist   10/04/2023  1:27 PM  Cc: Kayla Jeoffrey RAMAN, FNP

## 2023-11-22 ENCOUNTER — Other Ambulatory Visit: Payer: Self-pay | Admitting: Obstetrics & Gynecology

## 2023-11-22 DIAGNOSIS — Z1231 Encounter for screening mammogram for malignant neoplasm of breast: Secondary | ICD-10-CM

## 2023-11-27 ENCOUNTER — Other Ambulatory Visit: Payer: Self-pay | Admitting: Adult Health

## 2023-11-28 ENCOUNTER — Encounter: Payer: Self-pay | Admitting: Radiology

## 2023-12-05 ENCOUNTER — Ambulatory Visit: Admitting: Obstetrics & Gynecology

## 2023-12-05 VITALS — BP 144/83 | HR 100 | Ht 64.0 in | Wt 234.0 lb

## 2023-12-05 DIAGNOSIS — N3010 Interstitial cystitis (chronic) without hematuria: Secondary | ICD-10-CM

## 2023-12-05 DIAGNOSIS — N3281 Overactive bladder: Secondary | ICD-10-CM

## 2023-12-05 NOTE — Progress Notes (Signed)
 Diagnosed with IC: 2018  Current Meds:  Elmiron  DMSO irrigations Dietary restrictions    Pt states her symptoms have been stable, no exacerbations, although not perfect Wants to continue on this cycle  Blood pressure (!) 144/83, pulse 100, height 5' 4 (1.626 m), weight 234 lb (106.1 kg).    The external urethra meatus was prepped with betadine DMSO 50 cc was instilled in the usual fashion after the bladder was catheterized and emptied completely 50cc was instilled into the bladder without difficulty and the patient tolerated well She will refrain from voiding as long as possible  Follow up in 8 weeks, or as patient requests based on her symptom complex

## 2023-12-16 ENCOUNTER — Ambulatory Visit
Admission: RE | Admit: 2023-12-16 | Discharge: 2023-12-16 | Disposition: A | Discharge status: Home / Self Care | Source: Ambulatory Visit | Attending: Obstetrics & Gynecology | Admitting: Obstetrics & Gynecology

## 2023-12-16 DIAGNOSIS — Z1231 Encounter for screening mammogram for malignant neoplasm of breast: Secondary | ICD-10-CM

## 2023-12-22 ENCOUNTER — Ambulatory Visit (HOSPITAL_COMMUNITY): Payer: Self-pay | Admitting: Obstetrics & Gynecology

## 2024-01-11 ENCOUNTER — Other Ambulatory Visit: Payer: Self-pay | Admitting: Obstetrics & Gynecology

## 2024-01-30 ENCOUNTER — Other Ambulatory Visit: Payer: Self-pay | Admitting: Adult Health

## 2024-02-06 ENCOUNTER — Ambulatory Visit: Admitting: Obstetrics & Gynecology

## 2024-02-06 ENCOUNTER — Encounter: Payer: Self-pay | Admitting: Obstetrics & Gynecology

## 2024-02-06 VITALS — BP 133/85 | HR 97 | Ht 64.0 in | Wt 232.0 lb

## 2024-02-06 DIAGNOSIS — N3010 Interstitial cystitis (chronic) without hematuria: Secondary | ICD-10-CM | POA: Diagnosis not present

## 2024-02-06 DIAGNOSIS — N3281 Overactive bladder: Secondary | ICD-10-CM

## 2024-02-06 NOTE — Progress Notes (Signed)
 Diagnosed with IC: 2018  Current Meds:  Elmiron  + DMSO Dietary restrictions    Pt states her symptoms have been stable, no exacerbations, although not perfect Wants to continue on this cycle  Blood pressure 133/85, pulse 97, height 5' 4 (1.626 m), weight 232 lb (105.2 kg).    The external urethra meatus was prepped with betadine DMSO 50 cc was instilled in the usual fashion after the bladder was catheterized and emptied completely 50cc was instilled into the bladder without difficulty and the patient tolerated well She will refrain from voiding as long as possible  Follow up in 8 weeks, or as patient requests based on her symptom complex

## 2024-03-02 ENCOUNTER — Other Ambulatory Visit

## 2024-03-08 ENCOUNTER — Ambulatory Visit: Admitting: Family Medicine

## 2024-04-05 ENCOUNTER — Ambulatory Visit: Admitting: Obstetrics & Gynecology
